# Patient Record
Sex: Male | Born: 1935 | Race: Black or African American | Hispanic: No | Marital: Single | Smoking: Former smoker
Health system: Southern US, Community
[De-identification: ages and names within clinical notes are randomized; demographics above are authoritative.]

## PROBLEM LIST (undated history)

## (undated) DIAGNOSIS — C801 Malignant (primary) neoplasm, unspecified: Secondary | ICD-10-CM

## (undated) DIAGNOSIS — M329 Systemic lupus erythematosus, unspecified: Secondary | ICD-10-CM

## (undated) DIAGNOSIS — R627 Adult failure to thrive: Secondary | ICD-10-CM

## (undated) DIAGNOSIS — D649 Anemia, unspecified: Secondary | ICD-10-CM

## (undated) DIAGNOSIS — Z930 Tracheostomy status: Secondary | ICD-10-CM

## (undated) DIAGNOSIS — IMO0002 Reserved for concepts with insufficient information to code with codable children: Secondary | ICD-10-CM

## (undated) DIAGNOSIS — K219 Gastro-esophageal reflux disease without esophagitis: Secondary | ICD-10-CM

## (undated) DIAGNOSIS — R0600 Dyspnea, unspecified: Secondary | ICD-10-CM

## (undated) DIAGNOSIS — M069 Rheumatoid arthritis, unspecified: Secondary | ICD-10-CM

## (undated) DIAGNOSIS — F32A Depression, unspecified: Secondary | ICD-10-CM

## (undated) DIAGNOSIS — I10 Essential (primary) hypertension: Secondary | ICD-10-CM

## (undated) DIAGNOSIS — F329 Major depressive disorder, single episode, unspecified: Secondary | ICD-10-CM

## (undated) HISTORY — PX: LARYNGECTOMY: SUR815

## (undated) HISTORY — PX: LEG SURGERY: SHX1003

---

## 2009-11-28 ENCOUNTER — Ambulatory Visit: Payer: Self-pay | Admitting: Radiology

## 2009-11-28 ENCOUNTER — Emergency Department (HOSPITAL_BASED_OUTPATIENT_CLINIC_OR_DEPARTMENT_OTHER): Admission: EM | Admit: 2009-11-28 | Discharge: 2009-11-28 | Payer: Self-pay | Admitting: Emergency Medicine

## 2010-12-16 LAB — DIFFERENTIAL
Basophils Absolute: 0.1 10*3/uL (ref 0.0–0.1)
Basophils Relative: 2 % — ABNORMAL HIGH (ref 0–1)
Eosinophils Absolute: 0.2 10*3/uL (ref 0.0–0.7)
Lymphocytes Relative: 9 % — ABNORMAL LOW (ref 12–46)
Monocytes Absolute: 0.8 10*3/uL (ref 0.1–1.0)
Monocytes Relative: 9 % (ref 3–12)
Neutro Abs: 7.6 10*3/uL (ref 1.7–7.7)

## 2010-12-16 LAB — CBC
MCHC: 33.2 g/dL (ref 30.0–36.0)
Platelets: 149 10*3/uL — ABNORMAL LOW (ref 150–400)
RDW: 15 % (ref 11.5–15.5)

## 2010-12-16 LAB — COMPREHENSIVE METABOLIC PANEL
AST: 32 U/L (ref 0–37)
Albumin: 4 g/dL (ref 3.5–5.2)
Calcium: 9.2 mg/dL (ref 8.4–10.5)
GFR calc Af Amer: >60 mL/min (ref >60)
Glucose, Bld: 90 mg/dL (ref 70–99)
Total Bilirubin: 0.6 mg/dL (ref 0.3–1.2)
Total Protein: 7.5 g/dL (ref 6.0–8.3)

## 2010-12-16 LAB — LIPASE, BLOOD: Lipase: 86 U/L (ref 23–300)

## 2011-01-02 ENCOUNTER — Emergency Department (HOSPITAL_BASED_OUTPATIENT_CLINIC_OR_DEPARTMENT_OTHER)
Admission: EM | Admit: 2011-01-02 | Discharge: 2011-01-03 | Disposition: A | Discharge status: Home / Self Care | Payer: Medicare Other | Attending: Emergency Medicine | Admitting: Emergency Medicine

## 2011-01-02 DIAGNOSIS — L0231 Cutaneous abscess of buttock: Secondary | ICD-10-CM | POA: Insufficient documentation

## 2011-01-02 DIAGNOSIS — K219 Gastro-esophageal reflux disease without esophagitis: Secondary | ICD-10-CM | POA: Insufficient documentation

## 2011-01-02 DIAGNOSIS — Z79899 Other long term (current) drug therapy: Secondary | ICD-10-CM | POA: Insufficient documentation

## 2011-01-02 DIAGNOSIS — I10 Essential (primary) hypertension: Secondary | ICD-10-CM | POA: Insufficient documentation

## 2011-01-02 DIAGNOSIS — L03317 Cellulitis of buttock: Secondary | ICD-10-CM | POA: Insufficient documentation

## 2011-01-24 ENCOUNTER — Emergency Department (HOSPITAL_BASED_OUTPATIENT_CLINIC_OR_DEPARTMENT_OTHER)
Admission: EM | Admit: 2011-01-24 | Discharge: 2011-01-24 | Disposition: A | Discharge status: Home / Self Care | Payer: PRIVATE HEALTH INSURANCE | Attending: Emergency Medicine | Admitting: Emergency Medicine

## 2011-01-24 DIAGNOSIS — L03317 Cellulitis of buttock: Secondary | ICD-10-CM | POA: Insufficient documentation

## 2011-01-24 DIAGNOSIS — L0231 Cutaneous abscess of buttock: Secondary | ICD-10-CM | POA: Insufficient documentation

## 2011-01-27 ENCOUNTER — Emergency Department (HOSPITAL_BASED_OUTPATIENT_CLINIC_OR_DEPARTMENT_OTHER)
Admission: EM | Admit: 2011-01-27 | Discharge: 2011-01-27 | Disposition: A | Discharge status: Home / Self Care | Payer: PRIVATE HEALTH INSURANCE | Attending: Emergency Medicine | Admitting: Emergency Medicine

## 2011-01-27 DIAGNOSIS — K219 Gastro-esophageal reflux disease without esophagitis: Secondary | ICD-10-CM | POA: Insufficient documentation

## 2011-01-27 DIAGNOSIS — I10 Essential (primary) hypertension: Secondary | ICD-10-CM | POA: Insufficient documentation

## 2011-01-27 DIAGNOSIS — Z4800 Encounter for change or removal of nonsurgical wound dressing: Secondary | ICD-10-CM | POA: Insufficient documentation

## 2011-01-27 DIAGNOSIS — Z79899 Other long term (current) drug therapy: Secondary | ICD-10-CM | POA: Insufficient documentation

## 2011-11-15 ENCOUNTER — Emergency Department (INDEPENDENT_AMBULATORY_CARE_PROVIDER_SITE_OTHER): Payer: PRIVATE HEALTH INSURANCE

## 2011-11-15 ENCOUNTER — Encounter (HOSPITAL_BASED_OUTPATIENT_CLINIC_OR_DEPARTMENT_OTHER): Payer: Self-pay

## 2011-11-15 ENCOUNTER — Other Ambulatory Visit: Payer: Self-pay

## 2011-11-15 ENCOUNTER — Emergency Department (HOSPITAL_BASED_OUTPATIENT_CLINIC_OR_DEPARTMENT_OTHER)
Admission: EM | Admit: 2011-11-15 | Discharge: 2011-11-15 | Disposition: A | Discharge status: Home / Self Care | Payer: PRIVATE HEALTH INSURANCE | Attending: Emergency Medicine | Admitting: Emergency Medicine

## 2011-11-15 DIAGNOSIS — R296 Repeated falls: Secondary | ICD-10-CM | POA: Insufficient documentation

## 2011-11-15 DIAGNOSIS — R209 Unspecified disturbances of skin sensation: Secondary | ICD-10-CM

## 2011-11-15 DIAGNOSIS — R5381 Other malaise: Secondary | ICD-10-CM | POA: Insufficient documentation

## 2011-11-15 DIAGNOSIS — W19XXXA Unspecified fall, initial encounter: Secondary | ICD-10-CM

## 2011-11-15 DIAGNOSIS — I10 Essential (primary) hypertension: Secondary | ICD-10-CM | POA: Insufficient documentation

## 2011-11-15 DIAGNOSIS — E860 Dehydration: Secondary | ICD-10-CM | POA: Insufficient documentation

## 2011-11-15 DIAGNOSIS — R42 Dizziness and giddiness: Secondary | ICD-10-CM | POA: Insufficient documentation

## 2011-11-15 DIAGNOSIS — M069 Rheumatoid arthritis, unspecified: Secondary | ICD-10-CM | POA: Insufficient documentation

## 2011-11-15 DIAGNOSIS — M329 Systemic lupus erythematosus, unspecified: Secondary | ICD-10-CM | POA: Insufficient documentation

## 2011-11-15 DIAGNOSIS — I951 Orthostatic hypotension: Secondary | ICD-10-CM | POA: Insufficient documentation

## 2011-11-15 DIAGNOSIS — R079 Chest pain, unspecified: Secondary | ICD-10-CM

## 2011-11-15 DIAGNOSIS — K219 Gastro-esophageal reflux disease without esophagitis: Secondary | ICD-10-CM | POA: Insufficient documentation

## 2011-11-15 HISTORY — DX: Malignant (primary) neoplasm, unspecified: C80.1

## 2011-11-15 HISTORY — DX: Essential (primary) hypertension: I10

## 2011-11-15 HISTORY — DX: Systemic lupus erythematosus, unspecified: M32.9

## 2011-11-15 HISTORY — DX: Reserved for concepts with insufficient information to code with codable children: IMO0002

## 2011-11-15 HISTORY — DX: Depression, unspecified: F32.A

## 2011-11-15 HISTORY — DX: Adult failure to thrive: R62.7

## 2011-11-15 HISTORY — DX: Major depressive disorder, single episode, unspecified: F32.9

## 2011-11-15 HISTORY — DX: Anemia, unspecified: D64.9

## 2011-11-15 HISTORY — DX: Rheumatoid arthritis, unspecified: M06.9

## 2011-11-15 HISTORY — DX: Dyspnea, unspecified: R06.00

## 2011-11-15 HISTORY — DX: Gastro-esophageal reflux disease without esophagitis: K21.9

## 2011-11-15 LAB — DIFFERENTIAL
Basophils Absolute: 0 10*3/uL (ref 0.0–0.1)
Basophils Relative: 0 % (ref 0–1)
Lymphs Abs: 0.9 10*3/uL (ref 0.7–4.0)
Monocytes Absolute: 1.1 10*3/uL — ABNORMAL HIGH (ref 0.1–1.0)
Neutro Abs: 7.7 10*3/uL (ref 1.7–7.7)

## 2011-11-15 LAB — BASIC METABOLIC PANEL
BUN: 25 mg/dL — ABNORMAL HIGH (ref 6–23)
CO2: 24 mEq/L (ref 19–32)
Calcium: 9.6 mg/dL (ref 8.4–10.5)
GFR calc non Af Amer: 43 mL/min — ABNORMAL LOW (ref >90)
Potassium: 4.8 mEq/L (ref 3.5–5.1)
Sodium: 140 mEq/L (ref 135–145)

## 2011-11-15 LAB — TROPONIN I: Troponin I: <0.3 ng/mL (ref <0.30)

## 2011-11-15 LAB — URINALYSIS, ROUTINE W REFLEX MICROSCOPIC
Glucose, UA: NEGATIVE mg/dL
Hgb urine dipstick: NEGATIVE
Nitrite: NEGATIVE
Protein, ur: NEGATIVE mg/dL

## 2011-11-15 LAB — CBC: RBC: 4.47 MIL/uL (ref 4.22–5.81)

## 2011-11-15 MED ORDER — SODIUM CHLORIDE 0.9 % IV BOLUS (SEPSIS)
1000.0000 mL | Freq: Once | INTRAVENOUS | Status: AC
  Administered 2011-11-15: 1000 mL via INTRAVENOUS

## 2011-11-15 MED ORDER — OXYCODONE-ACETAMINOPHEN 5-325 MG PO TABS
2.0000 | ORAL_TABLET | Freq: Once | ORAL | Status: AC
  Administered 2011-11-15: 2 via ORAL

## 2011-11-15 MED ORDER — OXYCODONE-ACETAMINOPHEN 5-325 MG PO TABS
ORAL_TABLET | ORAL | Status: AC
  Administered 2011-11-15: 2 via ORAL
  Filled 2011-11-15: qty 2

## 2011-11-15 NOTE — ED Notes (Signed)
Patient transported to X-ray 

## 2011-11-15 NOTE — ED Notes (Signed)
Per [facility], pt with history of standing up too quickly and having dizziness, then falling.  Appears to be the cause of a fall today, pt reports that he got dizzy and his L leg "gave out on me".

## 2011-11-15 NOTE — Discharge Instructions (Signed)
Dehydration Dehydration is the reduction of water and fluid from the body to a level below that required for proper functioning. CAUSES  Dehydration occurs when there is excessive fluid loss from the body or when loss of normal fluids is not adequately replaced.  Loss of fluids occurs in vomiting, diarrhea, excessive sweating, excessive urine output, or excessive loss of fluid from the lungs (as occurs in fever or in patients on a ventilator).   Inadequate fluid replacement occurs with nausea or decreased appetite due to illness, sore throat, or mouth pain.  SYMPTOMS  Mild dehydration  Thirst (infants and young children may not be able to tell you they are thirsty).   Dry lips.   Slightly dry mouth membranes.  Moderate dehydration  Very dry mouth membranes.   Sunken eyes.   Sunken soft spot (fontanelle) on infant's head.   Skin does not bounce back quickly when lightly pinched and released.   Decreased urine production.   Decreased tear production.  Severe dehydration  Rapid, weak pulse (more than 100 beats per minute at rest).   Cold hands and feet.   Loss of ability to sweat in spite of heat and temperature.   Rapid breathing.   Blue lips.   Confusion, lethargy, difficult to arouse.   Minimal urine production.   No tears.  DIAGNOSIS  Your caregiver will diagnose dehydration based on your symptoms and your exam. Blood and urine tests will help confirm the diagnosis. The diagnostic evaluation should also identify the cause of dehydration. PREVENTION  The body depends on a proper balance of fluid and salts (electrolytes) for normal function. Adequate fluid intake in the presence of illness or other stresses (such as extreme exercise) is important.  TREATMENT   Mild dehydration is safe to self-treat for most ages as long as it does not worsen. Contact your caregiver for even mild dehydration in infants and the elderly.   In teenagers and adults with moderate  dehydration, careful home treatment (as outlined below) can be safe. Phone contact with a caregiver is advised. Children under 10 years of age with moderate dehydration should see a caregiver.   If you or your child is severely dehydrated, go to a hospital for treatment. Intravenous (IV) fluids will quickly reverse dehydration and are often lifesaving in young children, infants, and elderly persons.  HOME CARE INSTRUCTIONS  Small amounts of fluids should be taken frequently. Large amounts at one time may not be tolerated. Plain water may be harmful in infants and the elderly. Oral rehydration solutions (ORS) are available at pharmacies and grocery stores. ORS replaces water and important electrolytes in proper proportions. Sports drinks are not as effective as ORS and may be harmful because the sugar can make diarrhea worse.  As a general guideline for children, replace any new fluid losses from diarrhea and/or vomiting with ORS as follows:   If your child weighs 22 pounds or under (10 kg or less), give 60-120 mL (1/4-1/2 cup or 2-4 ounces) of ORS for each diarrheal stool or vomiting episode.   If your child weighs more than 22 pounds (more than 10 kg), give 120-240 mL (1/2-1 cup or 4-8 ounces) of ORS for each diarrheal stool or vomiting episode.   If your child is vomiting, it may be helpful to give the above ORS replacement in 5 mL (1 teaspoon) amounts every 5 minutes and increase as tolerated.   While correcting for dehydration, children should eat normally. However, foods high in sugar should be   avoided because they may worsen diarrhea. Large amounts of carbonated soft drinks, juice, gelatin desserts, and other highly sugared drinks should be avoided.   After correction of dehydration, other liquids that are appealing to the child may be added. Children should drink small amounts of fluids frequently and fluids should be increased as tolerated. Children should drink enough fluids to keep urine  clear or pale yellow.   Adults should eat normally while drinking more fluids than usual. Drink small amounts of fluids frequently and increase the amount as tolerated. Drink enough fluids to keep urine clear or pale yellow. Broths, weak decaffeinated tea, lemon-lime soft drinks (allowed to go flat), and ORS replace fluids and electrolytes.   Avoid:   Carbonated drinks.   Juice.   Extremely hot or cold fluids.   Caffeine drinks.   Fatty, greasy foods.   Alcohol.   Tobacco.   Too much intake of anything at one time.   Gelatin desserts.   Probiotics are active cultures of beneficial bacteria. They may lessen the amount and number of diarrheal stools in adults. Probiotics can be found in yogurt with active cultures and in supplements.   Wash your hands well to avoid spreading germs (bacteria) and viruses.   Antidiarrheal medicines are not recommended for infants and children.   Only take over-the-counter or prescription medicines for pain, discomfort, or fever as directed by your caregiver. Do not give aspirin to children.   For adults with dehydration, ask your caregiver if you should continue all prescribed and over-the-counter medicines.   If your caregiver has given you a follow-up appointment, it is very important to keep that appointment. Not keeping the appointment could result in a lasting (chronic) or permanent injury and disability. If there is any problem keeping the appointment, you must call to reschedule.  SEEK IMMEDIATE MEDICAL CARE IF:   You are unable to keep fluids down or other symptoms become worse despite treatment.   Vomiting or diarrhea develops and becomes persistent.   There is vomiting of blood or green matter (bile).   There is blood in the stool or the stools are black and tarry.   There is no urine output in 6 to 8 hours or there is only a small amount of very dark urine.   Abdominal pain develops, increases, or localizes.   You or your child  has an oral temperature above 102 F (38.9 C), not controlled by medicine.   Your baby is older than 3 months with a rectal temperature of 102.0F (38.9 C) or higher.   Your baby is 3 months old or younger with a rectal temperature of 100.4 F (38 C) or higher.   You develop excessive weakness, dizziness, fainting, or extreme thirst.   You develop a rash, stiff neck, severe headache, or you become irritable, sleepy, or difficult to awaken.  MAKE SURE YOU:   Understand these instructions.   Will watch your condition.   Will get help right away if you are not doing well or get worse.  Document Released: 09/08/2005 Document Revised: 03/24/2011 Document Reviewed: 08/07/2009 [employer] Patient Information 2012 [employer]. 

## 2011-11-15 NOTE — ED Notes (Signed)
Pt has requested pain medication for his arthritis pain.  Spoke with Dr Golda Acre and will give pt percocet 5-325mg  tablet x2 tablets once.

## 2011-11-15 NOTE — ED Notes (Signed)
EDP Hosmer at bedside for evaluation and given patients ECG

## 2011-11-15 NOTE — ED Provider Notes (Signed)
History     CSN: [id number]  Arrival date & time 11/15/11  1058   First MD Initiated Contact with Patient 11/15/11 1111      Chief Complaint  Patient presents with  . Fall    (Consider location/radiation/quality/duration/timing/severity/associated sxs/prior treatment) HPI Comments: Patient presents with a presyncopal event.  Patient states he got up and walked him to the TV room and then was started ahead back to his room and he felt lightheaded.  He felt himself fall to the ground but did not completely lose consciousness.  Vision states he did have breakfast this morning.  He said he felt slightly weak all over this morning but otherwise was well and had no fevers, increased cough, nausea, vomiting or diarrhea.  Patient denies any chest pain, shortness of breath or palpitations.  She denies any prior cardiac history.  He does not feel lightheaded while lying in bed at this time.  Patient is a 78 y.o. male presenting with fall. The history is provided by the patient. No language interpreter was used.  Fall The accident occurred less than 1 hour ago. The fall occurred while walking. He landed on carpet. There was no blood loss. The patient is experiencing no pain. He was ambulatory at the scene. There was no entrapment after the fall. There was no drug use involved in the accident. There was no alcohol use involved in the accident. Pertinent negatives include no visual change, no fever, no numbness, no abdominal pain, no bowel incontinence, no nausea, no vomiting, no hematuria, no headaches, no hearing loss, no loss of consciousness and no tingling. The symptoms are aggravated by standing. He has tried nothing for the symptoms.    Past Medical History  Diagnosis Date  . RA (rheumatoid arthritis)   . Lupus   . GERD (gastroesophageal reflux disease)   . Failure to thrive in adult   . Depression   . Anemia   . Dyspnea   . Constipation   . Cancer     humeral cancer  . Hypertension      Past Surgical History  Procedure Date  . Leg surgery     History reviewed. No pertinent family history.  History  Substance Use Topics  . Smoking status: Not on file  . Smokeless tobacco: Not on file  . Alcohol Use:       Review of Systems  Constitutional: Negative.  Negative for fever and chills.  HENT: Negative.   Eyes: Negative.  Negative for discharge and redness.  Respiratory: Negative.  Negative for cough and shortness of breath.   Cardiovascular: Negative.  Negative for chest pain.  Gastrointestinal: Negative.  Negative for nausea, vomiting, abdominal pain and bowel incontinence.  Genitourinary: Negative.  Negative for hematuria.  Musculoskeletal: Negative.  Negative for back pain.  Skin: Negative.  Negative for color change and rash.  Neurological: Positive for dizziness and light-headedness. Negative for tingling, loss of consciousness, syncope, numbness and headaches.  Hematological: Negative.  Negative for adenopathy.  Psychiatric/Behavioral: Negative.  Negative for confusion.  All other systems reviewed and are negative.    Allergies  Review of patient's allergies indicates no known allergies.  Home Medications   Current Outpatient Rx  Name Route Sig Dispense Refill  . ASPIRIN 81 MG PO TABS Oral Take 81 mg by mouth daily.    . ATENOLOL 100 MG PO TABS Oral Take 100 mg by mouth daily.    Marland Kitchen CALCIUM CARBONATE 600 MG PO TABS Oral Take 600 mg by  mouth 2 (two) times daily with a meal.    . CETIRIZINE HCL 10 MG PO TABS Oral Take 10 mg by mouth at bedtime.    . FUROSEMIDE 20 MG PO TABS Oral Take 10 mg by mouth every other day.    Marland Kitchen HYPROMELLOSE 0.4 % OP SOLN Ophthalmic Apply 2 drops to eye 2 (two) times daily. Instill 2 drops to both eyes twice daily    . LIDOCAINE 5 % EX PTCH Transdermal Place 1 patch onto the skin every 12 (twelve) hours. 12 hr on and 12 hour off then discard    . LISINOPRIL 20 MG PO TABS Oral Take 20 mg by mouth daily.    Marland Kitchen OMEPRAZOLE 20 MG  PO CPDR Oral Take 20 mg by mouth daily.    . OXYCODONE-ACETAMINOPHEN 10-650 MG PO TABS Oral Take 1 tablet by mouth every 4 (four) hours as needed. 1 tablet up to 5 times a day as needed for pain, must wait at least 4 hours between doses.    Marland Kitchen PAROXETINE HCL 30 MG PO TABS Oral Take 30 mg by mouth daily.    Marland Kitchen PREDNISONE 5 MG PO TABS Oral Take 5 mg by mouth daily.    Marland Kitchen PSEUDOEPHEDRINE-GUAIFENESIN 30-100 MG/5ML PO SYRP Oral Take 5 mLs by mouth every 4 (four) hours as needed.    Marland Kitchen TAMSULOSIN HCL 0.4 MG PO CAPS Oral Take 0.8 mg by mouth at bedtime.    . TROSPIUM CHLORIDE 20 MG PO TABS Oral Take 20 mg by mouth 2 (two) times daily.    Marland Kitchen LORAZEPAM 0.5 MG PO TABS Oral Take 0.5 mg by mouth daily as needed.    Marland Kitchen POLYETHYLENE GLYCOL 3350 PO PACK Oral Take 17 g by mouth daily as needed.      BP 70/38  Pulse 81  Temp(Src) 98.4 F (36.9 C) (Oral)  Resp 20  Ht 5\' 9"  (1.753 m)  Wt 190 lb (86.183 kg)  BMI 28.06 kg/m2  SpO2 93%  Physical Exam  Nursing note and vitals reviewed. Constitutional: He is oriented to person, place, and time. He appears well-developed and well-nourished.  Non-toxic appearance. He does not have a sickly appearance.  HENT:  Head: Normocephalic and atraumatic.       The patient speaks with the help of a voice box  Eyes: Conjunctivae, EOM and lids are normal. Pupils are equal, round, and reactive to light.  Neck: Trachea normal, normal range of motion and full passive range of motion without pain. Neck supple.       Patient has a stoma  Cardiovascular: Normal rate, regular rhythm and normal heart sounds.   Pulmonary/Chest: Effort normal and breath sounds normal. No respiratory distress.  Abdominal: Soft. Normal appearance. He exhibits no distension. There is no tenderness. There is no rebound and no CVA tenderness.  Musculoskeletal: Normal range of motion.  Neurological: He is alert and oriented to person, place, and time. He has normal strength.  Skin: Skin is warm, dry and  intact. No rash noted.  Psychiatric: He has a normal mood and affect. His behavior is normal. Judgment and thought content normal.    ED Course  Procedures (including critical care time)  Labs Reviewed  CBC - Abnormal; Notable for the following:    Hemoglobin 12.2 (*)    HCT 37.5 (*)    RDW 15.7 (*)    Platelets 144 (*)    All other components within normal limits  DIFFERENTIAL - Abnormal; Notable for the following:  Neutrophils Relative 79 (*)    Lymphocytes Relative 9 (*)    Monocytes Absolute 1.1 (*)    All other components within normal limits  TROPONIN I  BASIC METABOLIC PANEL  URINALYSIS, ROUTINE W REFLEX MICROSCOPIC   No results found.   No diagnosis found.   Date: 11/15/2011  Rate: 76  Rhythm: normal sinus rhythm  QRS Axis: right  Intervals: normal  ST/T Wave abnormalities: normal  Conduction Disutrbances:nonspecific intraventricular conduction delay  Narrative Interpretation:   Old EKG Reviewed: none available    MDM  Patient does have positive orthostatic vital signs and it appears that his presyncopal event was likely due to some dehydration given the decrease in his blood pressure with standing.  We will rehydrate the patient here and recheck his vital signs for improvement and symptomatic improvement.  Patient does not have any acute dysrhythmias on his EKG to explain his symptoms.  We will check a basic set of labs to look for any glucose, electrolytes or other anemic abnormalities.        Nat Christen, MD 11/15/11 1226

## 2012-02-19 ENCOUNTER — Encounter (HOSPITAL_BASED_OUTPATIENT_CLINIC_OR_DEPARTMENT_OTHER): Payer: Self-pay | Admitting: Emergency Medicine

## 2012-02-19 ENCOUNTER — Emergency Department (HOSPITAL_BASED_OUTPATIENT_CLINIC_OR_DEPARTMENT_OTHER)
Admission: EM | Admit: 2012-02-19 | Discharge: 2012-02-19 | Disposition: A | Discharge status: Home / Self Care | Payer: Medicare Other | Attending: Emergency Medicine | Admitting: Emergency Medicine

## 2012-02-19 DIAGNOSIS — M329 Systemic lupus erythematosus, unspecified: Secondary | ICD-10-CM | POA: Insufficient documentation

## 2012-02-19 DIAGNOSIS — M069 Rheumatoid arthritis, unspecified: Secondary | ICD-10-CM | POA: Insufficient documentation

## 2012-02-19 DIAGNOSIS — IMO0001 Reserved for inherently not codable concepts without codable children: Secondary | ICD-10-CM | POA: Insufficient documentation

## 2012-02-19 DIAGNOSIS — Z7982 Long term (current) use of aspirin: Secondary | ICD-10-CM | POA: Insufficient documentation

## 2012-02-19 DIAGNOSIS — Z79899 Other long term (current) drug therapy: Secondary | ICD-10-CM | POA: Insufficient documentation

## 2012-02-19 DIAGNOSIS — K219 Gastro-esophageal reflux disease without esophagitis: Secondary | ICD-10-CM | POA: Insufficient documentation

## 2012-02-19 DIAGNOSIS — I10 Essential (primary) hypertension: Secondary | ICD-10-CM | POA: Insufficient documentation

## 2012-02-19 DIAGNOSIS — F329 Major depressive disorder, single episode, unspecified: Secondary | ICD-10-CM | POA: Insufficient documentation

## 2012-02-19 DIAGNOSIS — F3289 Other specified depressive episodes: Secondary | ICD-10-CM | POA: Insufficient documentation

## 2012-02-19 DIAGNOSIS — R52 Pain, unspecified: Secondary | ICD-10-CM | POA: Insufficient documentation

## 2012-02-19 MED ORDER — OXYCODONE-ACETAMINOPHEN 10-650 MG PO TABS: 1.0000 | ORAL_TABLET | ORAL | Status: AC | PRN

## 2012-02-19 MED ORDER — OXYCODONE-ACETAMINOPHEN 5-325 MG PO TABS
2.0000 | ORAL_TABLET | Freq: Once | ORAL | Status: AC
  Administered 2012-02-19: 2 via ORAL
  Filled 2012-02-19: qty 2

## 2012-02-19 MED ORDER — GI COCKTAIL ~~LOC~~
30.0000 mL | Freq: Once | ORAL | Status: AC
  Administered 2012-02-19: 30 mL via ORAL
  Filled 2012-02-19: qty 30

## 2012-02-19 NOTE — ED Notes (Addendum)
Pt has chronic full body pain. [facility] home is out of Percocet as of 4am this morning. Has been taking it up to 5 times a day prn.

## 2012-02-19 NOTE — ED Notes (Signed)
[facility] notified for transport 

## 2012-02-19 NOTE — ED Provider Notes (Signed)
History     CSN: [id number]  Arrival date & time 02/19/12  0018   First MD Initiated Contact with Patient 02/19/12 0139      Chief Complaint  Patient presents with  . Generalized Body Aches    (Consider location/radiation/quality/duration/timing/severity/associated sxs/prior treatment) HPI This is a 78 year old black male with a history of chronic pain. The pain is generalized but felt worse and his upper arms. He states his nursing home has run out of his usual pain medication and he has not had any since 4:00 yesterday afternoon. He states the pain is moderate to severe now and request his usual Percocet 10/650. He is also complaining of "gas".  Past Medical History  Diagnosis Date  . RA (rheumatoid arthritis)   . Lupus   . GERD (gastroesophageal reflux disease)   . Failure to thrive in adult   . Depression   . Anemia   . Dyspnea   . Constipation   . Cancer     humeral cancer  . Hypertension     Past Surgical History  Procedure Date  . Leg surgery     No family history on file.  History  Substance Use Topics  . Smoking status: Not on file  . Smokeless tobacco: Not on file  . Alcohol Use:       Review of Systems  All other systems reviewed and are negative.    Allergies  Review of patient's allergies indicates no known allergies.  Home Medications   Current Outpatient Rx  Name Route Sig Dispense Refill  . ASPIRIN 81 MG PO TABS Oral Take 81 mg by mouth daily.    . ATENOLOL 100 MG PO TABS Oral Take 100 mg by mouth daily.    Marland Kitchen CALCIUM CARBONATE 600 MG PO TABS Oral Take 648 mg by mouth 2 (two) times daily with a meal.     . CETIRIZINE HCL 10 MG PO TABS Oral Take 10 mg by mouth at bedtime.    . FUROSEMIDE 20 MG PO TABS Oral Take 10 mg by mouth every other day.    Marland Kitchen HYPROMELLOSE 0.4 % OP SOLN Ophthalmic Apply 2 drops to eye 2 (two) times daily. Instill 2 drops to both eyes twice daily    . LIDOCAINE 5 % EX PTCH Transdermal Place 1 patch onto the skin  every 12 (twelve) hours. 12 hr on and 12 hour off then discard    . LISINOPRIL 20 MG PO TABS Oral Take 20 mg by mouth daily.    Marland Kitchen OMEPRAZOLE 20 MG PO CPDR Oral Take 20 mg by mouth daily.    . OXYCODONE-ACETAMINOPHEN 10-650 MG PO TABS Oral Take 1 tablet by mouth every 4 (four) hours as needed. 1 tablet up to 5 times a day as needed for pain, must wait at least 4 hours between doses.    Marland Kitchen PAROXETINE HCL 30 MG PO TABS Oral Take 30 mg by mouth daily.    Marland Kitchen PREDNISONE 5 MG PO TABS Oral Take 5 mg by mouth daily.    Marland Kitchen PSEUDOEPHEDRINE-GUAIFENESIN 30-100 MG/5ML PO SYRP Oral Take 5 mLs by mouth every 4 (four) hours as needed.    Marland Kitchen TAMSULOSIN HCL 0.4 MG PO CAPS Oral Take 0.8 mg by mouth at bedtime.    . TROSPIUM CHLORIDE 20 MG PO TABS Oral Take 20 mg by mouth 2 (two) times daily.    Marland Kitchen LORAZEPAM 0.5 MG PO TABS Oral Take 0.5 mg by mouth daily as needed.    Marland Kitchen  POLYETHYLENE GLYCOL 3350 PO PACK Oral Take 17 g by mouth daily as needed.      BP 169/96  Pulse 80  Temp(Src) 98.1 F (36.7 C) (Oral)  Resp 16  Ht 5\' 9"  (1.753 m)  Wt 180 lb (81.647 kg)  BMI 26.58 kg/m2  SpO2 98%  Physical Exam General: Well-developed, well-nourished male in no acute distress; appearance consistent with age of record HENT: normocephalic, atraumatic Eyes: pupils equal round and reactive to light; extraocular muscles intact Neck: supple; tracheostomy and laryngectomy Heart: regular rate and rhythm Lungs: clear to auscultation bilaterally Abdomen: soft; nondistended; nontender; bowel sounds present Extremities: No deformity; generalized pain on palpation or movement of joints, notably the left upper arm Neurologic: Awake, alert; motor function intact in all extremities and symmetric; no facial droop Skin: Warm and dry     ED Course  Procedures (including critical care time)     MDM          Hanley Seamen, MD 02/19/12 [phone]

## 2013-06-27 ENCOUNTER — Emergency Department (HOSPITAL_BASED_OUTPATIENT_CLINIC_OR_DEPARTMENT_OTHER): Payer: Medicare Other

## 2013-06-27 ENCOUNTER — Emergency Department (HOSPITAL_BASED_OUTPATIENT_CLINIC_OR_DEPARTMENT_OTHER)
Admission: EM | Admit: 2013-06-27 | Discharge: 2013-06-27 | Disposition: A | Discharge status: Home / Self Care | Payer: Medicare Other | Attending: Emergency Medicine | Admitting: Emergency Medicine

## 2013-06-27 ENCOUNTER — Encounter (HOSPITAL_BASED_OUTPATIENT_CLINIC_OR_DEPARTMENT_OTHER): Payer: Self-pay

## 2013-06-27 DIAGNOSIS — Z862 Personal history of diseases of the blood and blood-forming organs and certain disorders involving the immune mechanism: Secondary | ICD-10-CM | POA: Insufficient documentation

## 2013-06-27 DIAGNOSIS — Z85830 Personal history of malignant neoplasm of bone: Secondary | ICD-10-CM | POA: Insufficient documentation

## 2013-06-27 DIAGNOSIS — Y929 Unspecified place or not applicable: Secondary | ICD-10-CM | POA: Insufficient documentation

## 2013-06-27 DIAGNOSIS — F3289 Other specified depressive episodes: Secondary | ICD-10-CM | POA: Insufficient documentation

## 2013-06-27 DIAGNOSIS — M069 Rheumatoid arthritis, unspecified: Secondary | ICD-10-CM | POA: Insufficient documentation

## 2013-06-27 DIAGNOSIS — Y9389 Activity, other specified: Secondary | ICD-10-CM | POA: Insufficient documentation

## 2013-06-27 DIAGNOSIS — Z79899 Other long term (current) drug therapy: Secondary | ICD-10-CM | POA: Insufficient documentation

## 2013-06-27 DIAGNOSIS — W19XXXA Unspecified fall, initial encounter: Secondary | ICD-10-CM

## 2013-06-27 DIAGNOSIS — K219 Gastro-esophageal reflux disease without esophagitis: Secondary | ICD-10-CM | POA: Insufficient documentation

## 2013-06-27 DIAGNOSIS — W010XXA Fall on same level from slipping, tripping and stumbling without subsequent striking against object, initial encounter: Secondary | ICD-10-CM | POA: Insufficient documentation

## 2013-06-27 DIAGNOSIS — Z8739 Personal history of other diseases of the musculoskeletal system and connective tissue: Secondary | ICD-10-CM | POA: Insufficient documentation

## 2013-06-27 DIAGNOSIS — Z7982 Long term (current) use of aspirin: Secondary | ICD-10-CM | POA: Insufficient documentation

## 2013-06-27 DIAGNOSIS — S42001A Fracture of unspecified part of right clavicle, initial encounter for closed fracture: Secondary | ICD-10-CM

## 2013-06-27 DIAGNOSIS — IMO0002 Reserved for concepts with insufficient information to code with codable children: Secondary | ICD-10-CM | POA: Insufficient documentation

## 2013-06-27 DIAGNOSIS — Z791 Long term (current) use of non-steroidal anti-inflammatories (NSAID): Secondary | ICD-10-CM | POA: Insufficient documentation

## 2013-06-27 DIAGNOSIS — I10 Essential (primary) hypertension: Secondary | ICD-10-CM | POA: Insufficient documentation

## 2013-06-27 DIAGNOSIS — F329 Major depressive disorder, single episode, unspecified: Secondary | ICD-10-CM | POA: Insufficient documentation

## 2013-06-27 DIAGNOSIS — Z87891 Personal history of nicotine dependence: Secondary | ICD-10-CM | POA: Insufficient documentation

## 2013-06-27 DIAGNOSIS — S42033A Displaced fracture of lateral end of unspecified clavicle, initial encounter for closed fracture: Secondary | ICD-10-CM | POA: Insufficient documentation

## 2013-06-27 MED ORDER — OXYCODONE HCL 5 MG PO TABS
10.0000 mg | ORAL_TABLET | Freq: Once | ORAL | Status: AC
  Administered 2013-06-27: 10 mg via ORAL
  Filled 2013-06-27: qty 2

## 2013-06-27 NOTE — ED Notes (Signed)
Pt fell from a standing position yesterday and has right shoulder pain.

## 2013-06-27 NOTE — ED Provider Notes (Signed)
CSN: [id number]     Arrival date & time 06/27/13  1149 History   First MD Initiated Contact with Patient 06/27/13 1159     Chief Complaint  Patient presents with  . Shoulder Injury   (Consider location/radiation/quality/duration/timing/severity/associated sxs/prior Treatment) HPI Comments: Pt state that he was walking yesterday on the side walk and he got tripped up on his feet:pt states that he didn't have a loc:pt c/o right shoulder pain that started immediately after the fall:denies pain anywhere else:denies numbness or weakness  Patient is a 79 y.o. male presenting with shoulder injury. The history is provided by the patient. No language interpreter was used.  Shoulder Injury This is a new problem. The current episode started yesterday. The problem occurs constantly. The problem has been unchanged. Pertinent negatives include no neck pain, numbness or weakness. Exacerbated by: movement.    Past Medical History  Diagnosis Date  . RA (rheumatoid arthritis)   . Lupus   . GERD (gastroesophageal reflux disease)   . Failure to thrive in adult   . Depression   . Anemia   . Dyspnea   . Constipation   . Cancer     humeral cancer  . Hypertension    Past Surgical History  Procedure Laterality Date  . Leg surgery    . Laryngectomy     No family history on file. History  Substance Use Topics  . Smoking status: Former Games developer  . Smokeless tobacco: Not on file  . Alcohol Use: No    Review of Systems  HENT: Negative for neck pain.   Respiratory: Negative.   Cardiovascular: Negative.   Neurological: Negative for weakness and numbness.    Allergies  Review of patient's allergies indicates no known allergies.  Home Medications   Current Outpatient Rx  Name  Route  Sig  Dispense  Refill  . aspirin 81 MG tablet   Oral   Take 81 mg by mouth daily.         Marland Kitchen atenolol (TENORMIN) 100 MG tablet   Oral   Take 100 mg by mouth daily.         . calcium carbonate (OS-CAL) 600  MG TABS   Oral   Take 648 mg by mouth 2 (two) times daily with a meal.          . cetirizine (ZYRTEC) 10 MG tablet   Oral   Take 10 mg by mouth at bedtime.         . furosemide (LASIX) 20 MG tablet   Oral   Take 10 mg by mouth every other day.         . Hypromellose (NATURAL BALANCE TEARS) 0.4 % SOLN   Ophthalmic   Apply 2 drops to eye 2 (two) times daily. Instill 2 drops to both eyes twice daily         . lidocaine (LIDODERM) 5 %   Transdermal   Place 1 patch onto the skin every 12 (twelve) hours. 12 hr on and 12 hour off then discard         . lisinopril (PRINIVIL,ZESTRIL) 20 MG tablet   Oral   Take 20 mg by mouth daily.         Marland Kitchen LORazepam (ATIVAN) 0.5 MG tablet   Oral   Take 0.5 mg by mouth daily as needed.         Marland Kitchen omeprazole (PRILOSEC) 20 MG capsule   Oral   Take 20 mg by mouth daily.         Marland Kitchen  oxyCODONE-acetaminophen (PERCOCET) 10-650 MG per tablet   Oral   Take 1 tablet by mouth every 4 (four) hours as needed. 1 tablet up to 5 times a day as needed for pain, must wait at least 4 hours between doses.         Marland Kitchen PARoxetine (PAXIL) 30 MG tablet   Oral   Take 30 mg by mouth daily.         . polyethylene glycol (MIRALAX / GLYCOLAX) packet   Oral   Take 17 g by mouth daily as needed.         . predniSONE (DELTASONE) 5 MG tablet   Oral   Take 5 mg by mouth daily.         . pseudoephedrine-guaifenesin (TUSSIN PE) 30-100 MG/5ML SYRP   Oral   Take 5 mLs by mouth every 4 (four) hours as needed.         . Tamsulosin HCl (FLOMAX) 0.4 MG CAPS   Oral   Take 0.8 mg by mouth at bedtime.         . trospium (SANCTURA) 20 MG tablet   Oral   Take 20 mg by mouth 2 (two) times daily.          BP 149/74  Pulse 82  Temp(Src) 98.7 F (37.1 C) (Oral)  Resp 18  SpO2 94% Physical Exam  Nursing note and vitals reviewed. Constitutional: He is oriented to person, place, and time. He appears well-developed and well-nourished.  Cardiovascular:  Normal rate and regular rhythm.   Pulmonary/Chest: Effort normal and breath sounds normal.  Musculoskeletal: Normal range of motion.       Cervical back: Normal.       Thoracic back: Normal.       Lumbar back: Normal.  Pt unable to raise arm completely related to pain:grip strength equal:no spinal tenderness:pt is ambulating without any problem  Neurological: He is alert and oriented to person, place, and time.  Skin:  Bruise noted on the right clavicle    ED Course  Procedures (including critical care time) Labs Review Labs Reviewed - No data to display Imaging Review Dg Shoulder Right  06/27/2013   CLINICAL DATA:  Fall, right shoulder pain  EXAM: RIGHT SHOULDER - 2+ VIEW  COMPARISON:  None.  FINDINGS: There is a mildly displaced distal right clavicular fracture. Right proximal humerus is intact and appears properly located. Right lung apex is clear with the exception of mild apical pleural thickening. A clip projects over the neck.  IMPRESSION: Acute distal right clavicular fracture.   Electronically Signed   By: Christiana Pellant M.D.   On: 06/27/2013 12:10    MDM   1. Clavicle fracture, right, closed, initial encounter   2. Fall, initial encounter    Pt is neurologically intact:pt moving extremity:pt is neurologically intact:pt already on percocet at the nursing home:pt given follow up with DR. Hudnall:pt placed in sling for comfort    Teressa Lower, NP 06/27/13 1234

## 2013-06-28 NOTE — ED Provider Notes (Signed)
Medical screening examination/treatment/procedure(s) were performed by non-physician practitioner and as supervising physician I was immediately available for consultation/collaboration.   Candyce Churn, MD 06/28/13 [phone]

## 2013-09-24 ENCOUNTER — Emergency Department (HOSPITAL_BASED_OUTPATIENT_CLINIC_OR_DEPARTMENT_OTHER): Payer: Medicare Other

## 2013-09-24 ENCOUNTER — Emergency Department (HOSPITAL_BASED_OUTPATIENT_CLINIC_OR_DEPARTMENT_OTHER)
Admission: EM | Admit: 2013-09-24 | Discharge: 2013-09-24 | Disposition: A | Discharge status: Home / Self Care | Payer: Medicare Other | Attending: Emergency Medicine | Admitting: Emergency Medicine

## 2013-09-24 ENCOUNTER — Encounter (HOSPITAL_BASED_OUTPATIENT_CLINIC_OR_DEPARTMENT_OTHER): Payer: Self-pay | Admitting: Emergency Medicine

## 2013-09-24 DIAGNOSIS — I10 Essential (primary) hypertension: Secondary | ICD-10-CM | POA: Insufficient documentation

## 2013-09-24 DIAGNOSIS — Z862 Personal history of diseases of the blood and blood-forming organs and certain disorders involving the immune mechanism: Secondary | ICD-10-CM | POA: Insufficient documentation

## 2013-09-24 DIAGNOSIS — Z79899 Other long term (current) drug therapy: Secondary | ICD-10-CM | POA: Insufficient documentation

## 2013-09-24 DIAGNOSIS — Z8709 Personal history of other diseases of the respiratory system: Secondary | ICD-10-CM | POA: Insufficient documentation

## 2013-09-24 DIAGNOSIS — C50919 Malignant neoplasm of unspecified site of unspecified female breast: Secondary | ICD-10-CM | POA: Insufficient documentation

## 2013-09-24 DIAGNOSIS — F329 Major depressive disorder, single episode, unspecified: Secondary | ICD-10-CM | POA: Insufficient documentation

## 2013-09-24 DIAGNOSIS — K5900 Constipation, unspecified: Secondary | ICD-10-CM | POA: Insufficient documentation

## 2013-09-24 DIAGNOSIS — Z87891 Personal history of nicotine dependence: Secondary | ICD-10-CM | POA: Insufficient documentation

## 2013-09-24 DIAGNOSIS — J3489 Other specified disorders of nose and nasal sinuses: Secondary | ICD-10-CM | POA: Insufficient documentation

## 2013-09-24 DIAGNOSIS — F3289 Other specified depressive episodes: Secondary | ICD-10-CM | POA: Insufficient documentation

## 2013-09-24 DIAGNOSIS — IMO0002 Reserved for concepts with insufficient information to code with codable children: Secondary | ICD-10-CM | POA: Insufficient documentation

## 2013-09-24 DIAGNOSIS — C779 Secondary and unspecified malignant neoplasm of lymph node, unspecified: Secondary | ICD-10-CM

## 2013-09-24 DIAGNOSIS — Z85830 Personal history of malignant neoplasm of bone: Secondary | ICD-10-CM | POA: Insufficient documentation

## 2013-09-24 DIAGNOSIS — Z930 Tracheostomy status: Secondary | ICD-10-CM | POA: Insufficient documentation

## 2013-09-24 DIAGNOSIS — K219 Gastro-esophageal reflux disease without esophagitis: Secondary | ICD-10-CM | POA: Insufficient documentation

## 2013-09-24 DIAGNOSIS — R630 Anorexia: Secondary | ICD-10-CM | POA: Insufficient documentation

## 2013-09-24 DIAGNOSIS — R1310 Dysphagia, unspecified: Secondary | ICD-10-CM | POA: Insufficient documentation

## 2013-09-24 DIAGNOSIS — Z923 Personal history of irradiation: Secondary | ICD-10-CM | POA: Insufficient documentation

## 2013-09-24 DIAGNOSIS — Z7982 Long term (current) use of aspirin: Secondary | ICD-10-CM | POA: Insufficient documentation

## 2013-09-24 DIAGNOSIS — M329 Systemic lupus erythematosus, unspecified: Secondary | ICD-10-CM | POA: Insufficient documentation

## 2013-09-24 DIAGNOSIS — M069 Rheumatoid arthritis, unspecified: Secondary | ICD-10-CM | POA: Insufficient documentation

## 2013-09-24 HISTORY — DX: Tracheostomy status: Z93.0

## 2013-09-24 LAB — BASIC METABOLIC PANEL
BUN: 21 mg/dL (ref 6–23)
CHLORIDE: 101 meq/L (ref 96–112)
CO2: 26 mEq/L (ref 19–32)
Calcium: 9 mg/dL (ref 8.4–10.5)
Creatinine, Ser: 1.6 mg/dL — ABNORMAL HIGH (ref 0.50–1.35)
GFR, EST AFRICAN AMERICAN: 45 mL/min — AB (ref >90)
GFR, EST NON AFRICAN AMERICAN: 39 mL/min — AB (ref >90)
Glucose, Bld: 121 mg/dL — ABNORMAL HIGH (ref 70–99)
POTASSIUM: 5.3 meq/L (ref 3.7–5.3)
SODIUM: 140 meq/L (ref 137–147)

## 2013-09-24 MED ORDER — SODIUM CHLORIDE 0.9 % IV BOLUS (SEPSIS)
500.0000 mL | Freq: Once | INTRAVENOUS | Status: AC
  Administered 2013-09-24: 500 mL via INTRAVENOUS

## 2013-09-24 MED ORDER — OXYCODONE-ACETAMINOPHEN 5-325 MG PO TABS
2.0000 | ORAL_TABLET | Freq: Once | ORAL | Status: AC
  Administered 2013-09-24: 2 via ORAL
  Filled 2013-09-24: qty 2

## 2013-09-24 NOTE — ED Notes (Signed)
Pt brought to the ER by EMS. Pt has a Stoma. Site is Clean, Dry, and no redness noted.

## 2013-09-24 NOTE — ED Notes (Signed)
Report called to Andee Poles, Med tech.

## 2013-09-24 NOTE — ED Provider Notes (Signed)
CSN: [id number]     Arrival date & time 09/24/13  [id number] History   First MD Initiated Contact with Patient 09/24/13 [phone]     Chief Complaint  Patient presents with  . Dysphagia   (Consider location/radiation/quality/duration/timing/severity/associated sxs/prior Treatment) HPI Comments: 80 yo male with throat CA hx (pt unsure which, tracheostomy, RA, lupus, gerd, htn, on daily prednisone for years presents with odynophagia and dysphagia. No hx of similar in the past. No stroke hx or other neuro sxs.  Gradually worsening since Monday.  Pt tolerating liquids but not solids. Pt has fup with ENT in February. Pt had neck CA surgery 7 yrs ago at [facility], no known recurrence or recent check up.  No fevers.  No swelling of neck.  No known GI strictures. Radiation 7 yrs ago.   The history is provided by the patient.    Past Medical History  Diagnosis Date  . RA (rheumatoid arthritis)   . Lupus   . GERD (gastroesophageal reflux disease)   . Failure to thrive in adult   . Depression   . Anemia   . Dyspnea   . Constipation   . Cancer     humeral cancer  . Hypertension   . Tracheostomy in place    Past Surgical History  Procedure Laterality Date  . Leg surgery    . Laryngectomy     History reviewed. No pertinent family history. History  Substance Use Topics  . Smoking status: Former Research scientist (life sciences)  . Smokeless tobacco: Not on file  . Alcohol Use: No    Review of Systems  Constitutional: Positive for appetite change. Negative for fever and chills.  HENT: Positive for congestion.   Eyes: Negative for visual disturbance.  Respiratory: Negative for shortness of breath.   Cardiovascular: Negative for chest pain.  Gastrointestinal: Negative for vomiting and abdominal pain.  Musculoskeletal: Negative for back pain, neck pain and neck stiffness.  Skin: Negative for rash.  Neurological: Negative for weakness, light-headedness, numbness and headaches.    Allergies  Heparin; Morphine and related;  Sulfa antibiotics; and Tricyclic antidepressants  Home Medications   Current Outpatient Rx  Name  Route  Sig  Dispense  Refill  . acetaminophen (TYLENOL) 325 MG tablet   Oral   Take 325 mg by mouth every 4 (four) hours as needed.         Marland Kitchen aspirin 81 MG tablet   Oral   Take 81 mg by mouth daily.         Marland Kitchen atenolol (TENORMIN) 100 MG tablet   Oral   Take 100 mg by mouth daily.         . calcium carbonate (OS-CAL) 600 MG TABS   Oral   Take 648 mg by mouth 2 (two) times daily with a meal.          . Camphor-Eucalyptus-Menthol (VICKS VAPORUB EX)   Apply externally   Apply topically.         . diclofenac sodium (VOLTAREN) 1 % GEL   Topical   Apply 2 g topically 4 (four) times daily as needed.         . ENSURE PLUS (ENSURE PLUS) LIQD   Oral   Take 237 mLs by mouth.         . furosemide (LASIX) 20 MG tablet   Oral   Take 10 mg by mouth every other day.         . guaifenesin (ROBITUSSIN) 100 MG/5ML syrup   Oral  Take 200 mg by mouth 4 (four) times daily as needed for cough.         . Hypromellose (NATURAL BALANCE TEARS) 0.4 % SOLN   Ophthalmic   Apply 2 drops to eye 2 (two) times daily. Instill 2 drops to both eyes twice daily         . lidocaine (LIDODERM) 5 %   Transdermal   Place 1 patch onto the skin every 12 (twelve) hours. 12 hr on and 12 hour off then discard         . loratadine (CLARITIN) 10 MG tablet   Oral   Take 10 mg by mouth daily.         Marland Kitchen LORazepam (ATIVAN) 0.5 MG tablet   Oral   Take 0.5 mg by mouth daily as needed.         Marland Kitchen omeprazole (PRILOSEC) 20 MG capsule   Oral   Take 20 mg by mouth daily.         Marland Kitchen oxyCODONE-acetaminophen (PERCOCET) 10-650 MG per tablet   Oral   Take 1 tablet by mouth every 4 (four) hours as needed. 1 tablet up to 5 times a day as needed for pain, must wait at least 4 hours between doses.         Marland Kitchen PARoxetine (PAXIL) 30 MG tablet   Oral   Take 30 mg by mouth daily.         .  polyethylene glycol (MIRALAX / GLYCOLAX) packet   Oral   Take 17 g by mouth daily as needed.         . predniSONE (DELTASONE) 5 MG tablet   Oral   Take 5 mg by mouth daily.         . solifenacin (VESICARE) 5 MG tablet   Oral   Take 5 mg by mouth daily.         . Tamsulosin HCl (FLOMAX) 0.4 MG CAPS   Oral   Take 0.8 mg by mouth at bedtime.         Marland Kitchen zolpidem (AMBIEN) 5 MG tablet   Oral   Take 5 mg by mouth at bedtime as needed for sleep.         . cetirizine (ZYRTEC) 10 MG tablet   Oral   Take 10 mg by mouth at bedtime.          BP 138/72  Pulse 76  Temp(Src) 98.7 F (37.1 C) (Oral)  Resp 16  Ht 5\' 9"  (7.741 m)  Wt 180 lb (81.647 kg)  BMI 26.57 kg/m2  SpO2 93% Physical Exam  Nursing note and vitals reviewed. Constitutional: He is oriented to person, place, and time. He appears well-developed and well-nourished.  HENT:  Head: Normocephalic.  Anterior neck Stoma clear, no drainage, no erythema/ warmth or swelling, non tender, supple neck MMM  Eyes: Conjunctivae are normal. Right eye exhibits no discharge. Left eye exhibits no discharge.  Neck: Normal range of motion. Neck supple. No tracheal deviation present.  Cardiovascular: Normal rate and regular rhythm.   Pulmonary/Chest: Effort normal and breath sounds normal.  Abdominal: Soft. He exhibits no distension. There is no tenderness. There is no guarding.  Musculoskeletal: He exhibits no edema.  Neurological: He is alert and oriented to person, place, and time. No cranial nerve deficit. GCS eye subscore is 4. GCS verbal subscore is 5. GCS motor subscore is 6.  5+ strength in UE and LE with f/e at major joints. Sensation to palpation  intact in UE and LE. CNs 2-12 grossly intact.  EOMFI.  PERRL.   Finger nose and coordination intact bilateral.   Visual fields intact to finger testing.   Skin: Skin is warm. No rash noted.  Psychiatric: He has a normal mood and affect.    ED Course  Procedures  (including critical care time) Labs Review Labs Reviewed  BASIC METABOLIC PANEL - Abnormal; Notable for the following:    Glucose, Bld 121 (*)    Creatinine, Ser 1.60 (*)    GFR calc non Af Amer 39 (*)    GFR calc Af Amer 45 (*)    All other components within normal limits   Imaging Review Ct Soft Tissue Neck Wo Contrast  09/24/2013   CLINICAL DATA:  Difficulty swallowing for several days. History of laryngectomy.  EXAM: CT NECK WITHOUT CONTRAST  TECHNIQUE: Multidetector CT imaging of the neck was performed following the standard protocol without intravenous contrast.  COMPARISON:  None.  FINDINGS: Visualized posterior fossa structures are unremarkable. Parotid and submandibular glands show chronic atrophy but no focal lesion. There is mild mucosal inflammation of the left maxillary sinus.  Patient has had laryngectomy. Trachea is widely patent to the stoma at in the midline at the thoracic inlet.  The oropharynx appears unremarkable. There is constriction at the junction of the oropharynx and the cervical esophagus. There appears to be a hyperdense entity along the left side of the mucosal surface just above the stenosis that could be a clip or suture material. Below the stenosis, the cervical and thoracic esophagus are dilated and air-filled.  Postsurgical deformity and scarring is present in the region but there is no evidence of residual or recurrent mass or of enlarged lymph node.  Lung apices show emphysema and scarring.  IMPRESSION: Total laryngectomy. Apparent stenosis at the junction of the oropharynx and cervical esophagus which could present with difficulty swallowing. This would be most consistent with scarring. I do not see any identifiable acute process such as abscess or mass.   Electronically Signed   By: Nelson Chimes M.D.   On: 09/24/2013 11:16    EKG Interpretation   None       MDM   1. Dysphagia    Clinically concern for esophagitis from chronic steroids and radiation hx vs  esophageal stricture/ narrowing From radiation hx. With CA hx will CT scan to look for signs of recurrence and fup with GI. Plan for antifungal solution if CT unremarkable. Pt tolerating secretions.  Pt well appearing otherwise, non distress, no stridor..  IV fluids in ED.  Pt can tolerate po fluids. Discussed with on call ENT at [facility], they will see pt early this week, recommended boost/ et Ronney Asters until seen.  Results and differential diagnosis were discussed with the patient. Close follow up outpatient was discussed, patient comfortable with the plan.   Diagnosis: Dysphagia     Mariea Clonts, MD 09/24/13 1353

## 2013-09-24 NOTE — Discharge Instructions (Signed)
Call the ENT office Monday and they will see you. Take boost and liquid supplements until you are seen.  If you were given medicines take as directed.  If you are on coumadin or contraceptives realize their levels and effectiveness is altered by many different medicines.  If you have any reaction (rash, tongues swelling, other) to the medicines stop taking and see a physician.   Please follow up as directed and return to the ER or see a physician for new or worsening symptoms.  Thank you.  Dysphagia Swallowing problems (dysphagia) occur when solids and liquids seem to stick in your throat on the way down to your stomach, or the food takes longer to get to the stomach. Other symptoms include regurgitating food, noises coming from the throat, chest discomfort with swallowing, and a feeling of fullness or the feeling of something being stuck in your throat when swallowing. When blockage in your throat is complete it may be associated with drooling. CAUSES  Problems with swallowing may occur because of problems with the muscles. The food cannot be propelled in the usual manner into your stomach. You may have ulcers, scar tissue, or inflammation in the tube down which food travels from your mouth to your stomach (esophagus), which blocks food from passing normally into the stomach. Causes of inflammation include:  Acid reflux from your stomach into your esophagus.  Infection.  Radiation treatment for cancer.  Medicines taken without enough fluids to wash them down into your stomach. You may have nerve problems that prevent signals from being sent to the muscles of your esophagus to contract and move your food down to your stomach. Globus pharyngeus is a relatively common problem in which there is a sense of an obstruction or difficulty in swallowing, without any physical abnormalities of the swallowing passages being found. This problem usually improves over time with reassurance and testing to rule out  other causes. DIAGNOSIS Dysphagia can be diagnosed and its cause can be determined by tests in which you swallow a white substance that helps illuminate the inside of your throat (contrast medium) while X-rays are taken. Sometimes a flexible telescope that is inserted down your throat (endoscopy) to look at your esophagus and stomach is used. TREATMENT   If the dysphagia is caused by acid reflux or infection, medicines may be used.  If the dysphagia is caused by problems with your swallowing muscles, swallowing therapy may be used to help you strengthen your swallowing muscles.  If the dysphagia is caused by a blockage or mass, procedures to remove the blockage may be done. HOME CARE INSTRUCTIONS  Try to eat soft food that is easier to swallow and check your weight on a daily basis to be sure that it is not decreasing.  Be sure to drink liquids when sitting upright (not lying down). SEEK MEDICAL CARE IF:  You are losing weight because you are unable to swallow.  You are coughing when you drink liquids (aspiration).  You are coughing up partially digested food. SEEK IMMEDIATE MEDICAL CARE IF:  You are unable to swallow your own saliva .  You are having shortness of breath or a fever, or both.  You have a hoarse voice along with difficulty swallowing. MAKE SURE YOU:  Understand these instructions.  Will watch your condition.  Will get help right away if you are not doing well or get worse. Document Released: 09/05/2000 Document Revised: 05/11/2013 Document Reviewed: 02/25/2013 [facility] Patient Information 2014 [place].

## 2013-09-24 NOTE — ED Notes (Addendum)
Pt has stoma, pt having difficulty with swallowing.  No respiratory distress noted.

## 2014-07-12 ENCOUNTER — Emergency Department (HOSPITAL_COMMUNITY): Payer: Medicare Other

## 2014-07-12 ENCOUNTER — Encounter (HOSPITAL_COMMUNITY): Payer: Self-pay | Admitting: Emergency Medicine

## 2014-07-12 ENCOUNTER — Emergency Department (HOSPITAL_COMMUNITY)
Admission: EM | Admit: 2014-07-12 | Discharge: 2014-07-12 | Disposition: A | Discharge status: Home / Self Care | Payer: Medicare Other | Attending: Emergency Medicine | Admitting: Emergency Medicine

## 2014-07-12 DIAGNOSIS — Z87891 Personal history of nicotine dependence: Secondary | ICD-10-CM | POA: Diagnosis not present

## 2014-07-12 DIAGNOSIS — Z7901 Long term (current) use of anticoagulants: Secondary | ICD-10-CM | POA: Insufficient documentation

## 2014-07-12 DIAGNOSIS — I10 Essential (primary) hypertension: Secondary | ICD-10-CM | POA: Diagnosis not present

## 2014-07-12 DIAGNOSIS — M069 Rheumatoid arthritis, unspecified: Secondary | ICD-10-CM | POA: Diagnosis not present

## 2014-07-12 DIAGNOSIS — R091 Pleurisy: Secondary | ICD-10-CM | POA: Diagnosis not present

## 2014-07-12 DIAGNOSIS — Z79899 Other long term (current) drug therapy: Secondary | ICD-10-CM | POA: Insufficient documentation

## 2014-07-12 DIAGNOSIS — I447 Left bundle-branch block, unspecified: Secondary | ICD-10-CM | POA: Diagnosis not present

## 2014-07-12 DIAGNOSIS — R011 Cardiac murmur, unspecified: Secondary | ICD-10-CM | POA: Insufficient documentation

## 2014-07-12 DIAGNOSIS — F329 Major depressive disorder, single episode, unspecified: Secondary | ICD-10-CM | POA: Insufficient documentation

## 2014-07-12 DIAGNOSIS — Z7952 Long term (current) use of systemic steroids: Secondary | ICD-10-CM | POA: Insufficient documentation

## 2014-07-12 DIAGNOSIS — Z862 Personal history of diseases of the blood and blood-forming organs and certain disorders involving the immune mechanism: Secondary | ICD-10-CM | POA: Insufficient documentation

## 2014-07-12 DIAGNOSIS — K219 Gastro-esophageal reflux disease without esophagitis: Secondary | ICD-10-CM | POA: Diagnosis not present

## 2014-07-12 DIAGNOSIS — R079 Chest pain, unspecified: Secondary | ICD-10-CM | POA: Diagnosis present

## 2014-07-12 DIAGNOSIS — Z85830 Personal history of malignant neoplasm of bone: Secondary | ICD-10-CM | POA: Diagnosis not present

## 2014-07-12 DIAGNOSIS — M329 Systemic lupus erythematosus, unspecified: Secondary | ICD-10-CM

## 2014-07-12 LAB — CBC
HEMATOCRIT: 38.9 % — AB (ref 39.0–52.0)
HEMOGLOBIN: 12.6 g/dL — AB (ref 13.0–17.0)
MCH: 27 pg (ref 26.0–34.0)
MCHC: 32.4 g/dL (ref 30.0–36.0)
MCV: 83.3 fL (ref 78.0–100.0)
Platelets: 153 10*3/uL (ref 150–400)
RBC: 4.67 MIL/uL (ref 4.22–5.81)
RDW: 14.8 % (ref 11.5–15.5)
WBC: 12.9 10*3/uL — AB (ref 4.0–10.5)

## 2014-07-12 LAB — BASIC METABOLIC PANEL
Anion gap: 13 (ref 5–15)
BUN: 21 mg/dL (ref 6–23)
CHLORIDE: 102 meq/L (ref 96–112)
CO2: 24 meq/L (ref 19–32)
Calcium: 9.3 mg/dL (ref 8.4–10.5)
Creatinine, Ser: 1.27 mg/dL (ref 0.50–1.35)
GFR calc Af Amer: 59 mL/min — ABNORMAL LOW (ref >90)
GFR, EST NON AFRICAN AMERICAN: 51 mL/min — AB (ref >90)
GLUCOSE: 106 mg/dL — AB (ref 70–99)
POTASSIUM: 5.4 meq/L — AB (ref 3.7–5.3)
SODIUM: 139 meq/L (ref 137–147)

## 2014-07-12 LAB — I-STAT TROPONIN, ED
TROPONIN I, POC: 0 ng/mL (ref 0.00–0.08)
Troponin i, poc: 0.01 ng/mL (ref 0.00–0.08)

## 2014-07-12 MED ORDER — IBUPROFEN 400 MG PO TABS: 400.0000 mg | ORAL_TABLET | Freq: Four times a day (QID) | ORAL | Status: DC | PRN

## 2014-07-12 MED ORDER — NITROGLYCERIN 0.4 MG SL SUBL
0.4000 mg | SUBLINGUAL_TABLET | SUBLINGUAL | Status: DC | PRN
  Administered 2014-07-12 (×2): 0.4 mg via SUBLINGUAL

## 2014-07-12 MED ORDER — NITROGLYCERIN 0.4 MG SL SUBL
SUBLINGUAL_TABLET | SUBLINGUAL | Status: AC
  Filled 2014-07-12: qty 1

## 2014-07-12 MED ORDER — HYDROCODONE-ACETAMINOPHEN 5-325 MG PO TABS
1.0000 | ORAL_TABLET | Freq: Once | ORAL | Status: AC
  Administered 2014-07-12: 1 via ORAL
  Filled 2014-07-12: qty 1

## 2014-07-12 MED ORDER — DEXAMETHASONE SODIUM PHOSPHATE 4 MG/ML IJ SOLN
10.0000 mg | Freq: Once | INTRAMUSCULAR | Status: AC
  Administered 2014-07-12: 10 mg via INTRAVENOUS
  Filled 2014-07-12: qty 3

## 2014-07-12 MED ORDER — IBUPROFEN 200 MG PO TABS: 400.0000 mg | ORAL_TABLET | Freq: Once | ORAL | Status: DC

## 2014-07-12 MED ORDER — ASPIRIN 81 MG PO CHEW: 162.0000 mg | CHEWABLE_TABLET | Freq: Once | ORAL | Status: DC

## 2014-07-12 MED ORDER — PREDNISONE 50 MG PO TABS: 50.0000 mg | ORAL_TABLET | Freq: Every day | ORAL | Status: DC

## 2014-07-12 MED ORDER — OXYCODONE-ACETAMINOPHEN 5-325 MG PO TABS
1.0000 | ORAL_TABLET | Freq: Once | ORAL | Status: AC
  Administered 2014-07-12: 1 via ORAL
  Filled 2014-07-12: qty 1

## 2014-07-12 NOTE — ED Notes (Signed)
[facility] arrived to transport patient. 

## 2014-07-12 NOTE — ED Notes (Signed)
Per EMS, pt from [facility]. Per patient, had non specific generalized chest pain for months. 0400 this morning, woke up him, worse than normal, had some SOB. Denies, N/V/D, LOC, diaphoresis. Pain worse with breathing. Pt has had a recent cough. Pt given 324 asa and 1 nitro by ems. Possibly lessened the pain, pain 5/10. Pt does have ST elevation on EMS EKG, per ems, not a stemi due to L bundle branch block. Unsure of history of heart issues.

## 2014-07-12 NOTE — ED Notes (Signed)
Pt states nitro did not help his pain. 5/10

## 2014-07-12 NOTE — Discharge Instructions (Signed)
We saw you in the ER for the chest pain. All the results in the ER are normal. We are not sure what is causing your symptoms. It appears to be lupus flare up. However, you have had a small heart attack, so we did get cardiology evaluation here, and although te ER results are normal, it is prudent that you establish care with the cardiology doctors.   Please return to the ER if your symptoms worsen; you have increased pain, fevers, chills, inability to keep any medications down, confusion. Otherwise see the outpatient doctor as requested.   Chest Wall Pain Chest wall pain is pain in or around the bones and muscles of your chest. It may take up to 6 weeks to get better. It may take longer if you must stay physically active in your work and activities.  CAUSES  Chest wall pain may happen on its own. However, it may be caused by:  A viral illness like the flu.  Injury.  Coughing.  Exercise.  Arthritis.  Fibromyalgia.  Shingles. HOME CARE INSTRUCTIONS   Avoid overtiring physical activity. Try not to strain or perform activities that cause pain. This includes any activities using your chest or your abdominal and side muscles, especially if heavy weights are used.  Put ice on the sore area.  Put ice in a plastic bag.  Place a towel between your skin and the bag.  Leave the ice on for 15-20 minutes per hour while awake for the first 2 days.  Only take over-the-counter or prescription medicines for pain, discomfort, or fever as directed by your caregiver. SEEK IMMEDIATE MEDICAL CARE IF:   Your pain increases, or you are very uncomfortable.  You have a fever.  Your chest pain becomes worse.  You have new, unexplained symptoms.  You have nausea or vomiting.  You feel sweaty or lightheaded.  You have a cough with phlegm (sputum), or you cough up blood. MAKE SURE YOU:   Understand these instructions.  Will watch your condition.  Will get help right away if you are not  doing well or get worse. Document Released: 09/08/2005 Document Revised: 12/01/2011 Document Reviewed: 05/05/2011 [facility] Patient Information 2015 [place], [place]. This information is not intended to replace advice given to you by your health care provider. Make sure you discuss any questions you have with your health care provider.  Chest Pain (Nonspecific) It is often hard to give a specific diagnosis for the cause of chest pain. There is always a chance that your pain could be related to something serious, such as a heart attack or a blood clot in the lungs. You need to follow up with your health care provider for further evaluation. CAUSES   Heartburn.  Pneumonia or bronchitis.  Anxiety or stress.  Inflammation around your heart (pericarditis) or lung (pleuritis or pleurisy).  A blood clot in the lung.  A collapsed lung (pneumothorax). It can develop suddenly on its own (spontaneous pneumothorax) or from trauma to the chest.  Shingles infection (herpes zoster virus). The chest wall is composed of bones, muscles, and cartilage. Any of these can be the source of the pain.  The bones can be bruised by injury.  The muscles or cartilage can be strained by coughing or overwork.  The cartilage can be affected by inflammation and become sore (costochondritis). DIAGNOSIS  Lab tests or other studies may be needed to find the cause of your pain. Your health care provider may have you take a test called an  ambulatory electrocardiogram (ECG). An ECG records your heartbeat patterns over a 24-hour period. You may also have other tests, such as:  Transthoracic echocardiogram (TTE). During echocardiography, sound waves are used to evaluate how blood flows through your heart.  Transesophageal echocardiogram (TEE).  Cardiac monitoring. This allows your health care provider to monitor your heart rate and rhythm in real time.  Holter monitor. This is a portable device that records your heartbeat  and can help diagnose heart arrhythmias. It allows your health care provider to track your heart activity for several days, if needed.  Stress tests by exercise or by giving medicine that makes the heart beat faster. TREATMENT   Treatment depends on what may be causing your chest pain. Treatment may include:  Acid blockers for heartburn.  Anti-inflammatory medicine.  Pain medicine for inflammatory conditions.  Antibiotics if an infection is present.  You may be advised to change lifestyle habits. This includes stopping smoking and avoiding alcohol, caffeine, and chocolate.  You may be advised to keep your head raised (elevated) when sleeping. This reduces the chance of acid going backward from your stomach into your esophagus. Most of the time, nonspecific chest pain will improve within 2-3 days with rest and mild pain medicine.  HOME CARE INSTRUCTIONS   If antibiotics were prescribed, take them as directed. Finish them even if you start to feel better.  For the next few days, avoid physical activities that bring on chest pain. Continue physical activities as directed.  Do not use any tobacco products, including cigarettes, chewing tobacco, or electronic cigarettes.  Avoid drinking alcohol.  Only take medicine as directed by your health care provider.  Follow your health care provider's suggestions for further testing if your chest pain does not go away.  Keep any follow-up appointments you made. If you do not go to an appointment, you could develop lasting (chronic) problems with pain. If there is any problem keeping an appointment, call to reschedule. SEEK MEDICAL CARE IF:   Your chest pain does not go away, even after treatment.  You have a rash with blisters on your chest.  You have a fever. SEEK IMMEDIATE MEDICAL CARE IF:   You have increased chest pain or pain that spreads to your arm, neck, jaw, back, or abdomen.  You have shortness of breath.  You have an  increasing cough, or you cough up blood.  You have severe back or abdominal pain.  You feel nauseous or vomit.  You have severe weakness.  You faint.  You have chills. This is an emergency. Do not wait to see if the pain will go away. Get medical help at once. Call your local emergency services (911 in [place] Do not drive yourself to the hospital. MAKE SURE YOU:   Understand these instructions.  Will watch your condition.  Will get help right away if you are not doing well or get worse. Document Released: 06/18/2005 Document Revised: 09/13/2013 Document Reviewed: 04/13/2008 [facility] Patient Information 2015 [place], [place]. This information is not intended to replace advice given to you by your health care provider. Make sure you discuss any questions you have with your health care provider.  Pleurisy Pleurisy is an inflammation and swelling of the lining of the lungs (pleura). Because of this inflammation, it hurts to breathe. It can be aggravated by coughing, laughing, or deep breathing. Pleurisy is often caused by an underlying infection or disease.  HOME CARE INSTRUCTIONS  Monitor your pleurisy for any changes. The following actions may  help to alleviate any discomfort you are experiencing:  Medicine may help with pain. Only take over-the-counter or prescription medicines for pain, discomfort, or fever as directed by your health care provider.  Only take antibiotic medicine as directed. Make sure to finish it even if you start to feel better. SEEK MEDICAL CARE IF:   Your pain is not controlled with medicine or is increasing.  You have an increase in pus-like (purulent) secretions brought up with coughing. SEEK IMMEDIATE MEDICAL CARE IF:   You have blue or dark lips, fingernails, or toenails.  You are coughing up blood.  You have increased difficulty breathing.  You have continuing pain unrelieved by medicine or pain lasting more than 1 week.  You have pain that  radiates into your neck, arms, or jaw.  You develop increased shortness of breath or wheezing.  You develop a fever, rash, vomiting, fainting, or other serious symptoms. MAKE SURE YOU:  Understand these instructions.   Will watch your condition.   Will get help right away if you are not doing well or get worse.  Document Released: 09/08/2005 Document Revised: 05/11/2013 Document Reviewed: 02/20/2013 [facility] Patient Information 2015 [place], [place]. This information is not intended to replace advice given to you by your health care provider. Make sure you discuss any questions you have with your health care provider.

## 2014-07-12 NOTE — ED Notes (Signed)
Pt is AAOX4, in NAD.

## 2014-07-17 NOTE — ED Provider Notes (Signed)
Pt seen primarily by dr. Wyvonnia Dusky.  2nd trop neg and pt dc'd home  Blanchie Dessert, MD 07/17/14 1321

## 2014-07-25 NOTE — ED Provider Notes (Signed)
CSN: [id number]     Arrival date & time 07/12/14  1310 History   First MD Initiated Contact with Patient 07/12/14 1313     Chief Complaint  Patient presents with  . Chest Pain     (Consider location/radiation/quality/duration/timing/severity/associated sxs/prior Treatment) HPI Comments: Pt comes in with chest pain. Hx of SLE, cancer. Pt on blood thinners for afib. Reports off and on chest pain for several months, which woke him up last night. Pain is described as sharp pain, worse with inspiration and movement. There is no exertional component. No nausea, diaphoresis.    Patient is a 80 y.o. male presenting with chest pain. The history is provided by the patient.  Chest Pain Associated symptoms: no abdominal pain, no cough and no shortness of breath     Past Medical History  Diagnosis Date  . RA (rheumatoid arthritis)   . Lupus   . GERD (gastroesophageal reflux disease)   . Failure to thrive in adult   . Depression   . Anemia   . Dyspnea   . Constipation   . Cancer     humeral cancer  . Hypertension   . Tracheostomy in place     Stoma   Past Surgical History  Procedure Laterality Date  . Leg surgery    . Laryngectomy     No family history on file. History  Substance Use Topics  . Smoking status: Former Research scientist (life sciences)  . Smokeless tobacco: Not on file  . Alcohol Use: No    Review of Systems  Constitutional: Negative for activity change and appetite change.  Respiratory: Negative for cough and shortness of breath.   Cardiovascular: Positive for chest pain.  Gastrointestinal: Negative for abdominal pain.  Genitourinary: Negative for dysuria.      Allergies  Heparin; Morphine and related; Sulfa antibiotics; and Tricyclic antidepressants  Home Medications   Prior to Admission medications   Medication Sig Start Date End Date Taking? Authorizing Provider  Calcium Carbonate Antacid 648 MG TABS Take 648 mg by mouth 2 (two) times daily.   Yes Historical Provider, MD   cholecalciferol (VITAMIN D) 1000 UNITS tablet Take 1,000 Units by mouth daily.   Yes Historical Provider, MD  fluticasone (FLONASE) 50 MCG/ACT nasal spray Place 1 spray into both nostrils daily.   Yes Historical Provider, MD  omeprazole (PRILOSEC) 20 MG capsule Take 20 mg by mouth at bedtime.    Yes Historical Provider, MD  oxyCODONE-acetaminophen (PERCOCET) 10-325 MG per tablet Take 1 tablet by mouth 6 (six) times daily. (at least 3 hours between doses) hold if sleepy or confused   Yes Historical Provider, MD  PARoxetine (PAXIL) 30 MG tablet Take 30 mg by mouth daily.   Yes Historical Provider, MD  polyethylene glycol (MIRALAX / GLYCOLAX) packet Take 17 g by mouth daily as needed (for constipation).    Yes Historical Provider, MD  predniSONE (DELTASONE) 10 MG tablet Take 10 mg by mouth daily with breakfast.   Yes Historical Provider, MD  rivaroxaban (XARELTO) 20 MG TABS tablet Take 20 mg by mouth daily with supper.   Yes Historical Provider, MD  Tamsulosin HCl (FLOMAX) 0.4 MG CAPS Take 0.4 mg by mouth 2 (two) times daily.    Yes Historical Provider, MD  traZODone (DESYREL) 50 MG tablet Take 50 mg by mouth at bedtime as needed for sleep.   Yes Historical Provider, MD  Wheat Dextrin (BENEFIBER DRINK MIX PO) Take 4 g by mouth 2 (two) times daily as needed. Mix 2 teaspoonfuls (  4 grams) in liquid and drink twice a day   Yes Historical Provider, MD  ibuprofen (ADVIL,MOTRIN) 400 MG tablet Take 1 tablet (400 mg total) by mouth every 6 (six) hours as needed. 07/12/14   Varney Biles, MD  predniSONE (DELTASONE) 50 MG tablet Take 1 tablet (50 mg total) by mouth daily. 07/12/14   Xzavier Swinger, MD   BP 160/83 mmHg  Pulse 100  Temp(Src) 98.3 F (36.8 C) (Oral)  Resp 18  SpO2 95% Physical Exam  Constitutional: He is oriented to person, place, and time. He appears well-developed.  HENT:  Head: Normocephalic and atraumatic.  Eyes: Conjunctivae and EOM are normal. Pupils are equal, round, and reactive to  light.  Neck: Normal range of motion. Neck supple.  Cardiovascular: Normal rate and regular rhythm.  Exam reveals no gallop and no friction rub.   Murmur heard. Pulmonary/Chest: Effort normal and breath sounds normal. He has no wheezes. He exhibits tenderness.  Abdominal: Soft. Bowel sounds are normal. He exhibits no distension. There is no tenderness. There is no rebound and no guarding.  Neurological: He is alert and oriented to person, place, and time.  Skin: Skin is warm.  Nursing note and vitals reviewed.   ED Course  Procedures (including critical care time) Labs Review Labs Reviewed  CBC - Abnormal; Notable for the following:    WBC 12.9 (*)    Hemoglobin 12.6 (*)    HCT 38.9 (*)    All other components within normal limits  BASIC METABOLIC PANEL - Abnormal; Notable for the following:    Potassium 5.4 (*)    Glucose, Bld 106 (*)    GFR calc non Af Amer 51 (*)    GFR calc Af Amer 59 (*)    All other components within normal limits  I-STAT TROPOININ, ED  I-STAT TROPOININ, ED    Imaging Review No results found.   EKG Interpretation   Date/Time:  Wednesday July 12 2014 13:17:42 EDT Ventricular Rate:  103 PR Interval:  199 QRS Duration: 140 QT Interval:  371 QTC Calculation: 486 R Axis:   -80 Text Interpretation:  Sinus tachycardia Atrial premature complexes Left  bundle branch block SGARBOSSA crtieria score 0 New LBBB, but had ivcd  before Confirmed by Kathrynn Humble, MD, Thelma Comp [phone]) on 07/12/2014 2:11:19 PM      MDM   Final diagnoses:  Pleurisy  SLE (systemic lupus erythematosus)  Chest pain, unspecified chest pain type  LBBB (left bundle branch block)    Pt comes in with cc of chest pain. Chest pain is reproducible with palpation and with deep inspiration. Pt is on anticoagulant, and so PE considered less likely, and we will not work him up for that. Pt's EKG is sgarbossa neg, and trops x 2 neg. Likely pleurisy - from SLE or chest wall pain.  If  tropsx 2 neg, will be discharged. Pt ok with the plan.  Varney Biles, MD 07/25/14 [phone]

## 2015-01-09 ENCOUNTER — Emergency Department (HOSPITAL_COMMUNITY)
Admission: EM | Admit: 2015-01-09 | Discharge: 2015-01-09 | Disposition: A | Discharge status: Home / Self Care | Payer: Medicare Other | Attending: Emergency Medicine | Admitting: Emergency Medicine

## 2015-01-09 ENCOUNTER — Emergency Department (HOSPITAL_COMMUNITY): Payer: Medicare Other

## 2015-01-09 ENCOUNTER — Encounter (HOSPITAL_COMMUNITY): Payer: Self-pay

## 2015-01-09 DIAGNOSIS — Z872 Personal history of diseases of the skin and subcutaneous tissue: Secondary | ICD-10-CM | POA: Insufficient documentation

## 2015-01-09 DIAGNOSIS — Z7982 Long term (current) use of aspirin: Secondary | ICD-10-CM | POA: Insufficient documentation

## 2015-01-09 DIAGNOSIS — Z79899 Other long term (current) drug therapy: Secondary | ICD-10-CM | POA: Insufficient documentation

## 2015-01-09 DIAGNOSIS — K219 Gastro-esophageal reflux disease without esophagitis: Secondary | ICD-10-CM | POA: Insufficient documentation

## 2015-01-09 DIAGNOSIS — I10 Essential (primary) hypertension: Secondary | ICD-10-CM | POA: Diagnosis not present

## 2015-01-09 DIAGNOSIS — Z85830 Personal history of malignant neoplasm of bone: Secondary | ICD-10-CM | POA: Insufficient documentation

## 2015-01-09 DIAGNOSIS — Z87891 Personal history of nicotine dependence: Secondary | ICD-10-CM | POA: Diagnosis not present

## 2015-01-09 DIAGNOSIS — Z8739 Personal history of other diseases of the musculoskeletal system and connective tissue: Secondary | ICD-10-CM | POA: Diagnosis not present

## 2015-01-09 DIAGNOSIS — Z7952 Long term (current) use of systemic steroids: Secondary | ICD-10-CM | POA: Insufficient documentation

## 2015-01-09 DIAGNOSIS — R42 Dizziness and giddiness: Secondary | ICD-10-CM

## 2015-01-09 LAB — BASIC METABOLIC PANEL
Anion gap: 9 (ref 5–15)
BUN: 18 mg/dL (ref 6–23)
CO2: 23 mmol/L (ref 19–32)
CREATININE: 1.42 mg/dL — AB (ref 0.50–1.35)
Calcium: 8.4 mg/dL (ref 8.4–10.5)
Chloride: 105 mmol/L (ref 96–112)
GFR calc Af Amer: 52 mL/min — ABNORMAL LOW (ref >90)
GFR calc non Af Amer: 45 mL/min — ABNORMAL LOW (ref >90)
Glucose, Bld: 88 mg/dL (ref 70–99)
Potassium: 4.3 mmol/L (ref 3.5–5.1)
Sodium: 137 mmol/L (ref 135–145)

## 2015-01-09 LAB — URINALYSIS, ROUTINE W REFLEX MICROSCOPIC
Bilirubin Urine: NEGATIVE
Glucose, UA: NEGATIVE mg/dL
HGB URINE DIPSTICK: NEGATIVE
Ketones, ur: NEGATIVE mg/dL
LEUKOCYTES UA: NEGATIVE
NITRITE: NEGATIVE
PROTEIN: NEGATIVE mg/dL
SPECIFIC GRAVITY, URINE: 1.011 (ref 1.005–1.030)
UROBILINOGEN UA: 0.2 mg/dL (ref 0.0–1.0)
pH: 7.5 (ref 5.0–8.0)

## 2015-01-09 LAB — CBC WITH DIFFERENTIAL/PLATELET
BASOS ABS: 0 10*3/uL (ref 0.0–0.1)
BASOS PCT: 0 % (ref 0–1)
EOS PCT: 2 % (ref 0–5)
Eosinophils Absolute: 0.2 10*3/uL (ref 0.0–0.7)
HEMATOCRIT: 34.8 % — AB (ref 39.0–52.0)
Hemoglobin: 10.9 g/dL — ABNORMAL LOW (ref 13.0–17.0)
LYMPHS PCT: 26 % (ref 12–46)
Lymphs Abs: 3.3 10*3/uL (ref 0.7–4.0)
MCH: 24.9 pg — AB (ref 26.0–34.0)
MCHC: 31.3 g/dL (ref 30.0–36.0)
MCV: 79.6 fL (ref 78.0–100.0)
Monocytes Absolute: 1.2 10*3/uL — ABNORMAL HIGH (ref 0.1–1.0)
Monocytes Relative: 9 % (ref 3–12)
Neutro Abs: 8.1 10*3/uL — ABNORMAL HIGH (ref 1.7–7.7)
Neutrophils Relative %: 63 % (ref 43–77)
Platelets: 167 10*3/uL (ref 150–400)
RBC: 4.37 MIL/uL (ref 4.22–5.81)
RDW: 15.3 % (ref 11.5–15.5)
WBC: 12.8 10*3/uL — ABNORMAL HIGH (ref 4.0–10.5)

## 2015-01-09 LAB — I-STAT TROPONIN, ED: Troponin i, poc: 0 ng/mL (ref 0.00–0.08)

## 2015-01-09 MED ORDER — SODIUM CHLORIDE 0.9 % IV BOLUS (SEPSIS)
500.0000 mL | Freq: Once | INTRAVENOUS | Status: AC
  Administered 2015-01-09: 500 mL via INTRAVENOUS

## 2015-01-09 MED ORDER — OXYCODONE-ACETAMINOPHEN 5-325 MG PO TABS
1.0000 | ORAL_TABLET | Freq: Once | ORAL | Status: AC
  Administered 2015-01-09: 1 via ORAL
  Filled 2015-01-09: qty 1

## 2015-01-09 NOTE — Discharge Instructions (Signed)
Return to the emergency room with worsening of symptoms, new symptoms or with symptoms that are concerning , especially severe worsening of headache, visual or speech changes, weakness in face, arms or legs. Please call your doctor for a followup appointment within 24-48 hours. When you talk to your doctor please let them know that you were seen in the emergency department and have them acquire all of your records so that they can discuss the findings with you and formulate a treatment plan to fully care for your new and ongoing problems.  Read below information and follow recommendations.  Dizziness Dizziness is a common problem. It is a feeling of unsteadiness or light-headedness. You may feel like you are about to faint. Dizziness can lead to injury if you stumble or fall. A person of any age group can suffer from dizziness, but dizziness is more common in older adults. CAUSES  Dizziness can be caused by many different things, including:  Middle ear problems.  Standing for too long.  Infections.  An allergic reaction.  Aging.  An emotional response to something, such as the sight of blood.  Side effects of medicines.  Tiredness.  Problems with circulation or blood pressure.  Excessive use of alcohol or medicines, or illegal drug use.  Breathing too fast (hyperventilation).  An irregular heart rhythm (arrhythmia).  A low red blood cell count (anemia).  Pregnancy.  Vomiting, diarrhea, fever, or other illnesses that cause body fluid loss (dehydration).  Diseases or conditions such as Parkinson's disease, high blood pressure (hypertension), diabetes, and thyroid problems.  Exposure to extreme heat. DIAGNOSIS  Your health care provider will ask about your symptoms, perform a physical exam, and perform an electrocardiogram (ECG) to record the electrical activity of your heart. Your health care provider may also perform other heart or blood tests to determine the cause of your  dizziness. These may include:  Transthoracic echocardiogram (TTE). During echocardiography, sound waves are used to evaluate how blood flows through your heart.  Transesophageal echocardiogram (TEE).  Cardiac monitoring. This allows your health care provider to monitor your heart rate and rhythm in real time.  Holter monitor. This is a portable device that records your heartbeat and can help diagnose heart arrhythmias. It allows your health care provider to track your heart activity for several days if needed.  Stress tests by exercise or by giving medicine that makes the heart beat faster. TREATMENT  Treatment of dizziness depends on the cause of your symptoms and can vary greatly. HOME CARE INSTRUCTIONS   Drink enough fluids to keep your urine clear or pale yellow. This is especially important in very hot weather. In older adults, it is also important in cold weather.  Take your medicine exactly as directed if your dizziness is caused by medicines. When taking blood pressure medicines, it is especially important to get up slowly.  Rise slowly from chairs and steady yourself until you feel okay.  In the morning, first sit up on the side of the bed. When you feel okay, stand slowly while holding onto something until you know your balance is fine.  Move your legs often if you need to stand in one place for a long time. Tighten and relax your muscles in your legs while standing.  Have someone stay with you for 1-2 days if dizziness continues to be a problem. Do this until you feel you are well enough to stay alone. Have the person call your health care provider if he or she notices  changes in you that are concerning.  Do not drive or use heavy machinery if you feel dizzy.  Do not drink alcohol. SEEK IMMEDIATE MEDICAL CARE IF:   Your dizziness or light-headedness gets worse.  You feel nauseous or vomit.  You have problems talking, walking, or using your arms, hands, or legs.  You  feel weak.  You are not thinking clearly or you have trouble forming sentences. It may take a friend or family member to notice this.  You have chest pain, abdominal pain, shortness of breath, or sweating.  Your vision changes.  You notice any bleeding.  You have side effects from medicine that seems to be getting worse rather than better. MAKE SURE YOU:   Understand these instructions.  Will watch your condition.  Will get help right away if you are not doing well or get worse. Document Released: 03/04/2001 Document Revised: 09/13/2013 Document Reviewed: 03/28/2011 [facility] Patient Information 2015 [place], [place]. This information is not intended to replace advice given to you by your health care provider. Make sure you discuss any questions you have with your health care provider.

## 2015-01-09 NOTE — ED Notes (Signed)
Pt ambulated with steady gait, and denied dizziness of lightheadedness.

## 2015-01-09 NOTE — ED Notes (Signed)
Pt has left with [facility] 

## 2015-01-09 NOTE — ED Notes (Signed)
Patient transported to X-ray 

## 2015-01-09 NOTE — ED Provider Notes (Signed)
CSN: [id number]     Arrival date & time 01/09/15  1746 History   First MD Initiated Contact with Patient 01/09/15 1749     Chief Complaint  Patient presents with  . Dizziness     (Consider location/radiation/quality/duration/timing/severity/associated sxs/prior Treatment) HPI  Shane George is a 81 y.o. male with PMH of RA, lupus, hypertension, anemia, depression presenting with lightheadedness with standing for 4 days. Resting resolves lightheadedness. No vertiginous symptoms.  Patient also reports palpitations but denies any chest pain. He endorses shortness of breath but states this is baseline form and has had no worsening of this. At [place] patient was found to be orthostatic with systolic 774 sitting to systolic 90 with standing. Pt given 650 NS by EMS. No headache, fevers, chills, visual changes, slurred speech, numbness, tingling, weakness. No head injury or loss of consciousness.   Past Medical History  Diagnosis Date  . RA (rheumatoid arthritis)   . Lupus   . GERD (gastroesophageal reflux disease)   . Failure to thrive in adult   . Depression   . Anemia   . Dyspnea   . Constipation   . Cancer     humeral cancer  . Hypertension   . Tracheostomy in place     Stoma   Past Surgical History  Procedure Laterality Date  . Leg surgery    . Laryngectomy     No family history on file. History  Substance Use Topics  . Smoking status: Former Research scientist (life sciences)  . Smokeless tobacco: Not on file  . Alcohol Use: No    Review of Systems 10 Systems reviewed and are negative for acute change except as noted in the HPI.    Allergies  Heparin; Morphine and related; Sulfa antibiotics; Tomato; Tramadol; and Tricyclic antidepressants  Home Medications   Prior to Admission medications   Medication Sig Start Date End Date Taking? Authorizing Provider  aspirin EC 81 MG tablet Take 81 mg by mouth daily.   Yes Historical Provider, MD  Calcium Carbonate Antacid 648 MG  TABS Take 648 mg by mouth 3 (three) times daily.    Yes Historical Provider, MD  cholecalciferol (VITAMIN D) 1000 UNITS tablet Take 1,000 Units by mouth daily.   Yes Historical Provider, MD  diclofenac sodium (VOLTAREN) 1 % GEL Apply 2 g topically 4 (four) times daily. Max dose 32gm's daily   Yes Historical Provider, MD  ENSURE (ENSURE) Take 237 mLs by mouth as needed (for nutrition).   Yes Historical Provider, MD  fluticasone (FLONASE) 50 MCG/ACT nasal spray Place 1 spray into both nostrils daily.   Yes Historical Provider, MD  guaifenesin (ROBITUSSIN) 100 MG/5ML syrup Take 200 mg by mouth 3 (three) times daily as needed for cough.   Yes Historical Provider, MD  ibuprofen (ADVIL,MOTRIN) 400 MG tablet Take 1 tablet (400 mg total) by mouth every 6 (six) hours as needed. Patient taking differently: Take 400 mg by mouth every 6 (six) hours as needed for moderate pain.  07/12/14  Yes Varney Biles, MD  lisinopril (PRINIVIL,ZESTRIL) 10 MG tablet Take 10 mg by mouth daily.   Yes Historical Provider, MD  loratadine (CLARITIN) 10 MG tablet Take 10 mg by mouth daily as needed for allergies.   Yes Historical Provider, MD  omeprazole (PRILOSEC) 20 MG capsule Take 20 mg by mouth 2 (two) times daily before a meal.    Yes Historical Provider, MD  oxyCODONE-acetaminophen (PERCOCET) 10-325 MG per tablet Take 1 tablet by mouth 6 (six) times daily. (  at least 3 hours between doses) hold if sleepy or confused   Yes Historical Provider, MD  PARoxetine (PAXIL) 30 MG tablet Take 30 mg by mouth daily.   Yes Historical Provider, MD  polyethylene glycol (MIRALAX / GLYCOLAX) packet Take 17 g by mouth daily as needed (for constipation).    Yes Historical Provider, MD  polyvinyl alcohol (LIQUIFILM TEARS) 1.4 % ophthalmic solution Place 1 drop into both eyes 3 (three) times daily.   Yes Historical Provider, MD  predniSONE (DELTASONE) 5 MG tablet Take 7.5 mg by mouth daily with breakfast.   Yes Historical Provider, MD   rivaroxaban (XARELTO) 20 MG TABS tablet Take 20 mg by mouth daily with supper.   Yes Historical Provider, MD  sennosides-docusate sodium (SENOKOT-S) 8.6-50 MG tablet Take 2 tablets by mouth 2 (two) times daily.   Yes Historical Provider, MD  Tamsulosin HCl (FLOMAX) 0.4 MG CAPS Take 0.4 mg by mouth 2 (two) times daily.    Yes Historical Provider, MD  traZODone (DESYREL) 50 MG tablet Take 50 mg by mouth at bedtime as needed for sleep.   Yes Historical Provider, MD  Wheat Dextrin (BENEFIBER DRINK MIX PO) Take 4 g by mouth 2 (two) times daily as needed. Mix 2 teaspoonfuls (4 grams) in liquid and drink twice a day   Yes Historical Provider, MD   BP 160/90 mmHg  Pulse 86  Temp(Src) 98.5 F (36.9 C) (Oral)  Resp 18  SpO2 98% Physical Exam  Constitutional: He appears well-developed and well-nourished. No distress.  HENT:  Head: Normocephalic and atraumatic.  Mouth/Throat: Oropharynx is clear and moist.  Eyes: Conjunctivae and EOM are normal. Pupils are equal, round, and reactive to light. Right eye exhibits no discharge. Left eye exhibits no discharge.  Neck: Normal range of motion. Neck supple.  No nuchal rigidity Tracheotomy  Cardiovascular: Normal rate and regular rhythm.   Pulmonary/Chest: Effort normal and breath sounds normal. No respiratory distress. He has no wheezes.  Abdominal: Soft. Bowel sounds are normal. He exhibits no distension. There is no tenderness.  Neurological: He is alert. No cranial nerve deficit. Coordination normal.  Speech is clear and goal oriented. Strength 5/5 in upper and lower extremities. Sensation intact. Intact rapid alternating movements, finger to nose, and heel to shin. Negative Romberg. No pronator drift. Normal gait with reported lightheadedness.  Skin: Skin is warm and dry. He is not diaphoretic.  Nursing note and vitals reviewed.   ED Course  Procedures (including critical care time) Labs Review Labs Reviewed  CBC WITH DIFFERENTIAL/PLATELET -  Abnormal; Notable for the following:    WBC 12.8 (*)    Hemoglobin 10.9 (*)    HCT 34.8 (*)    MCH 24.9 (*)    Neutro Abs 8.1 (*)    Monocytes Absolute 1.2 (*)    All other components within normal limits  BASIC METABOLIC PANEL - Abnormal; Notable for the following:    Creatinine, Ser 1.42 (*)    GFR calc non Af Amer 45 (*)    GFR calc Af Amer 52 (*)    All other components within normal limits  URINALYSIS, ROUTINE W REFLEX MICROSCOPIC  I-STAT TROPOININ, ED    Imaging Review Dg Chest 2 View  01/09/2015   CLINICAL DATA:  Syncope and heart palpitations for 3 days  EXAM: CHEST  2 VIEW  COMPARISON:  07/12/2014  FINDINGS: Moderate cardiac enlargement stable. Vascular pattern normal. No consolidation edema or effusion.  IMPRESSION: Stable cardiac enlargement with no acute findings  Electronically Signed   By: Skipper Cliche M.D.   On: 01/09/2015 19:03     EKG Interpretation   Date/Time:  Tuesday January 09 2015 17:54:46 EDT Ventricular Rate:  73 PR Interval:  202 QRS Duration: 147 QT Interval:  425 QTC Calculation: 468 R Axis:   -47 Text Interpretation:  Age not entered, assumed to be  81 years old for  purpose of ECG interpretation Sinus rhythm Multiform ventricular premature  complexes Borderline prolonged PR interval Left bundle branch block No  significant change since last tracing Confirmed by GOLDSTON  MD, SCOTT  ([id number]) on 01/09/2015 6:04:37 PM      MDM   Final diagnoses:  Orthostatic lightheadedness   Lightheadedness is transient, worsened with standing.  No HA, visual or speech changes, or weakness.  No chest pain. EKG without abnormalities and negative troponin. Pt without Afib or prior CVA. VSS. Patient with completely benign neurological exam. Patient orthostatic. Labwork reassuring. A shunt with mild anemia but denies melanotic stool or hematochezia. I doubt acute bleed. Total of all over 1 L of fluids given and patient denies lightheadedness with standing. Patient  denies any symptoms at this time. I doubt central vertigo due to presentation, benign exam, improvement of symptoms in ED. I doubt cardio pulmonary process. I suspect orthostasis. Patient is afebrile, nontoxic, and in no acute distress. Patient is appropriate for outpatient management and is stable for discharge. Patient to follow up with PCP.   Discussed return precautions with patient. Discussed all results and patient verbalizes understanding and agrees with plan.   This is a shared patient. This patient was discussed with the physician who saw and evaluated the patient and agrees with the plan.     Al Corpus, PA-C 01/09/15 2056  Sherwood Gambler, MD 01/13/15 [phone]

## 2015-01-09 NOTE — ED Notes (Signed)
[facility] CALLED @ 2059.

## 2015-01-09 NOTE — ED Notes (Signed)
Pt. Is from [facility]), pt. Complaining of dizziness and palpitation since Saturday. Pt. AxO x4. EMS noted elevation in V2-V6. EMS noted orthostatic changes from sitting to standing (594 systolic to 90 systolic). Pt. Received approx 650 ml NS in route.

## 2015-01-09 NOTE — ED Notes (Signed)
Pt called from room; requesting pain medication; PA aware

## 2016-07-22 IMAGING — CR DG CHEST 2V
2 series · 2 of 2 positions shown · non-contrast
Comparison: 07/12/2014

CLINICAL DATA: Syncope and heart palpitations for 3 days

EXAM:
CHEST  2 VIEW

[chest lat]
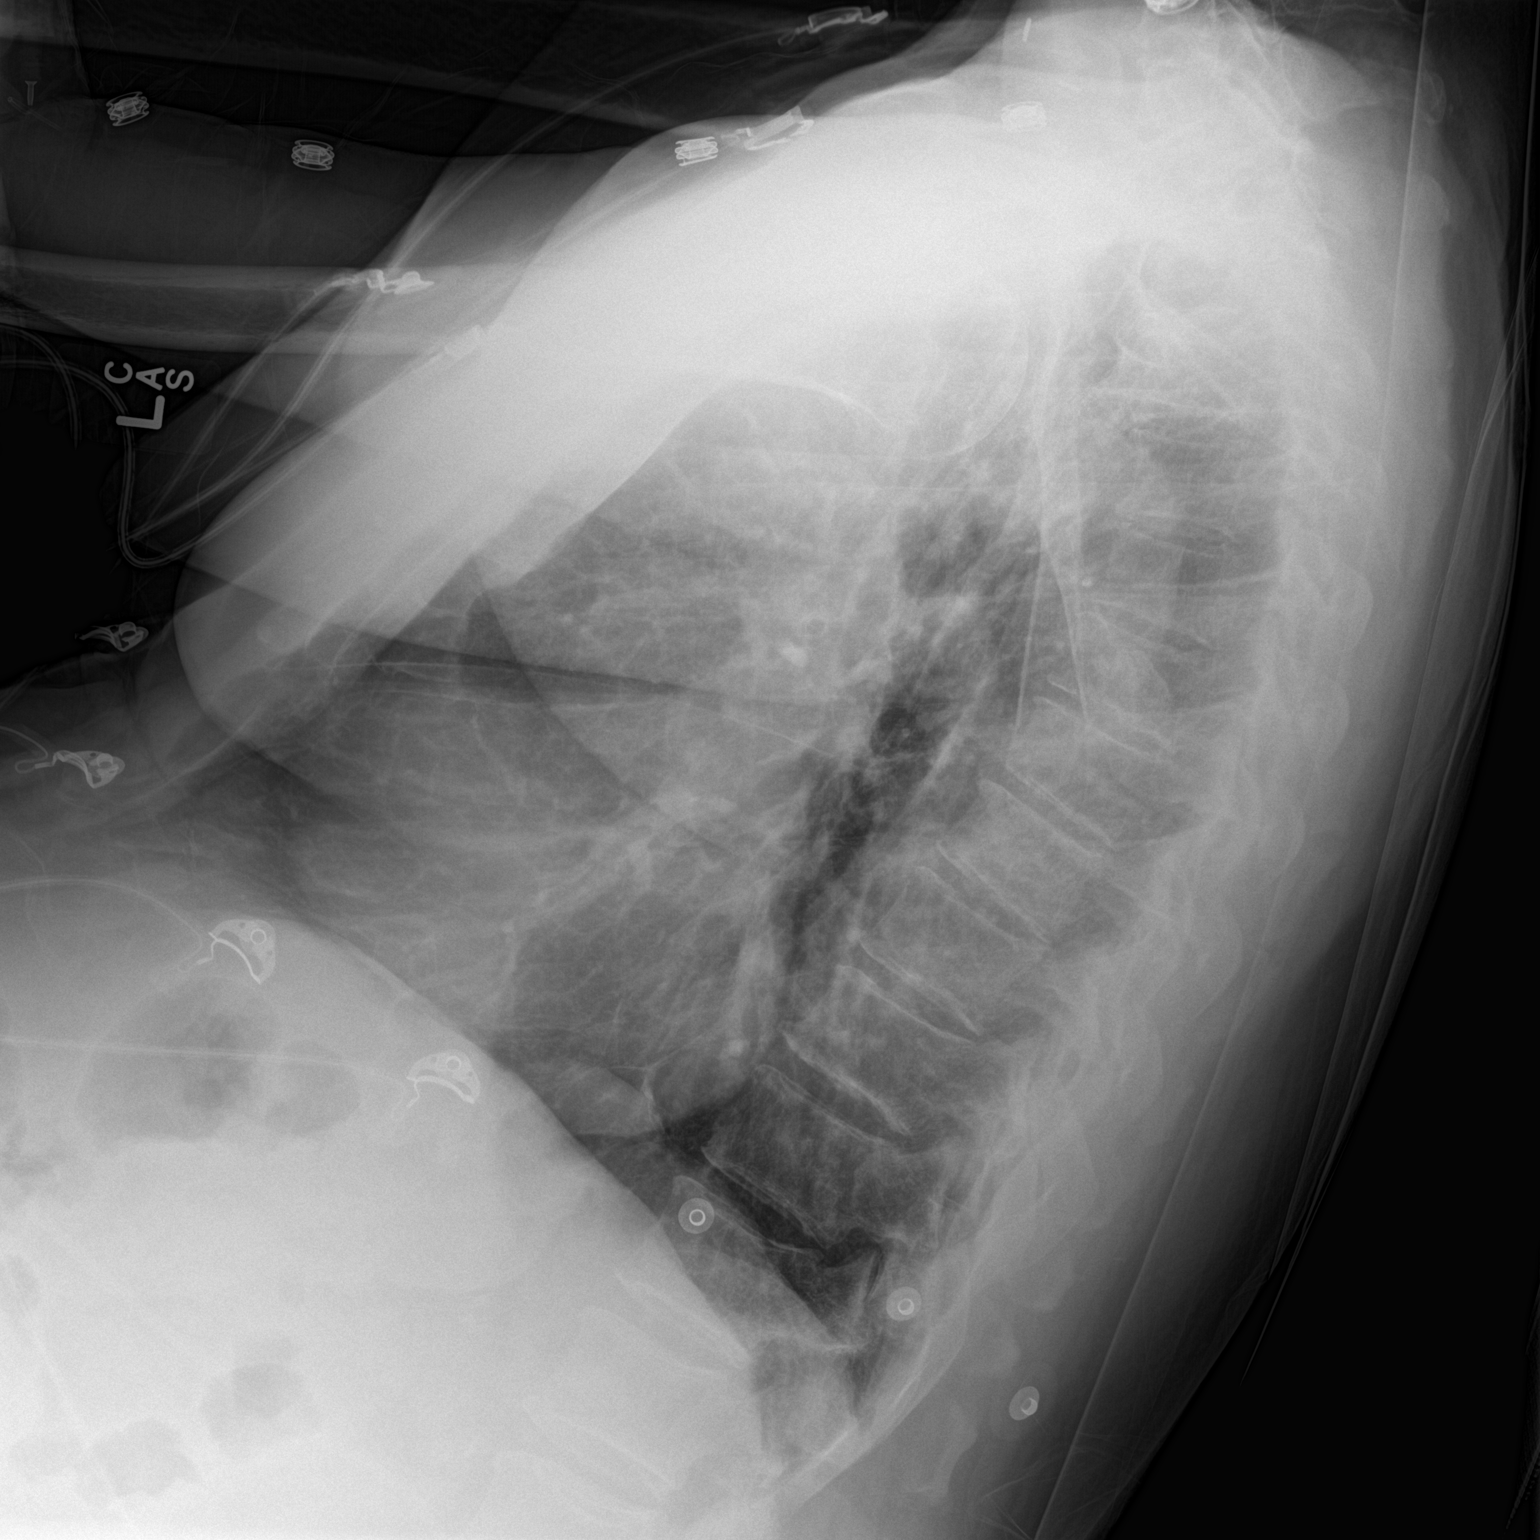

[chest ap]
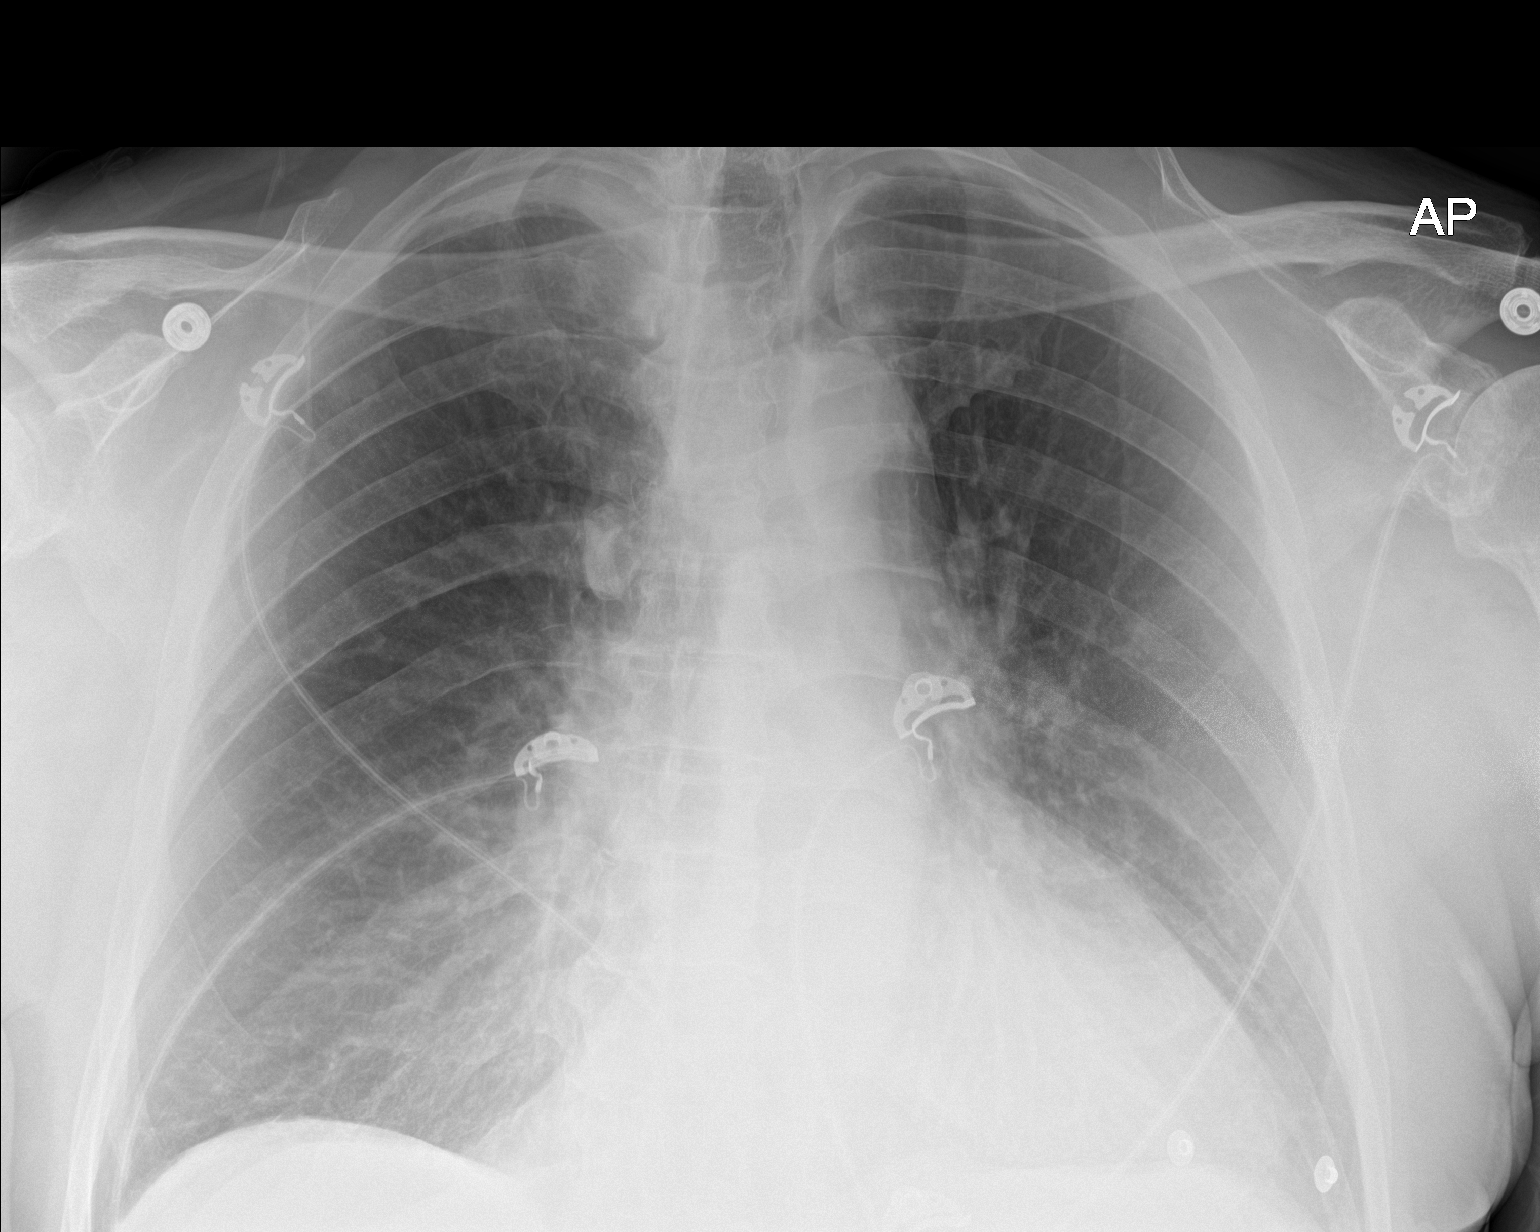

[2 of 2 positions shown; findings below may reference images not displayed]

FINDINGS: Moderate cardiac enlargement stable. Vascular pattern normal. No
consolidation edema or effusion.
IMPRESSION: Stable cardiac enlargement with no acute findings

## 2022-07-23 DEATH — deceased
